# Patient Record
Sex: Female | Born: 1955 | Race: Black or African American | Hispanic: No | Marital: Single | Smoking: Never smoker
Health system: Southern US, Community
[De-identification: ages and names within clinical notes are randomized; demographics above are authoritative.]

## PROBLEM LIST (undated history)

## (undated) DIAGNOSIS — N200 Calculus of kidney: Secondary | ICD-10-CM

## (undated) DIAGNOSIS — E119 Type 2 diabetes mellitus without complications: Secondary | ICD-10-CM

## (undated) DIAGNOSIS — E785 Hyperlipidemia, unspecified: Secondary | ICD-10-CM

## (undated) DIAGNOSIS — K219 Gastro-esophageal reflux disease without esophagitis: Secondary | ICD-10-CM

## (undated) DIAGNOSIS — I10 Essential (primary) hypertension: Secondary | ICD-10-CM

## (undated) DIAGNOSIS — I2510 Atherosclerotic heart disease of native coronary artery without angina pectoris: Secondary | ICD-10-CM

## (undated) DIAGNOSIS — M1990 Unspecified osteoarthritis, unspecified site: Secondary | ICD-10-CM

## (undated) DIAGNOSIS — E669 Obesity, unspecified: Secondary | ICD-10-CM

## (undated) HISTORY — PX: DILATION AND CURETTAGE OF UTERUS: SHX78

---

## 2003-11-11 ENCOUNTER — Ambulatory Visit (HOSPITAL_COMMUNITY): Admission: RE | Admit: 2003-11-11 | Discharge: 2003-11-11 | Payer: Self-pay | Admitting: Internal Medicine

## 2004-01-04 ENCOUNTER — Encounter: Admission: RE | Admit: 2004-01-04 | Discharge: 2004-01-04 | Payer: Self-pay | Admitting: Internal Medicine

## 2004-03-18 ENCOUNTER — Ambulatory Visit: Payer: Self-pay | Admitting: Internal Medicine

## 2005-05-02 ENCOUNTER — Inpatient Hospital Stay (HOSPITAL_COMMUNITY): Admission: EM | Admit: 2005-05-02 | Discharge: 2005-05-06 | Payer: Self-pay | Admitting: Emergency Medicine

## 2005-08-26 ENCOUNTER — Emergency Department (HOSPITAL_COMMUNITY): Admission: EM | Admit: 2005-08-26 | Discharge: 2005-08-26 | Payer: Self-pay | Admitting: Family Medicine

## 2010-06-24 ENCOUNTER — Emergency Department (HOSPITAL_COMMUNITY)
Admission: EM | Admit: 2010-06-24 | Discharge: 2010-06-24 | Disposition: A | Discharge status: Home / Self Care | Payer: Managed Care, Other (non HMO) | Attending: Emergency Medicine | Admitting: Emergency Medicine

## 2010-06-24 DIAGNOSIS — E1149 Type 2 diabetes mellitus with other diabetic neurological complication: Secondary | ICD-10-CM | POA: Insufficient documentation

## 2010-06-24 DIAGNOSIS — I10 Essential (primary) hypertension: Secondary | ICD-10-CM | POA: Insufficient documentation

## 2010-06-24 DIAGNOSIS — E1142 Type 2 diabetes mellitus with diabetic polyneuropathy: Secondary | ICD-10-CM | POA: Insufficient documentation

## 2010-06-25 ENCOUNTER — Ambulatory Visit (HOSPITAL_COMMUNITY)
Admission: AD | Admit: 2010-06-25 | Discharge: 2010-06-25 | Disposition: A | Discharge status: Home / Self Care | Payer: Managed Care, Other (non HMO) | Source: Ambulatory Visit | Attending: Emergency Medicine | Admitting: Emergency Medicine

## 2010-06-25 DIAGNOSIS — M79609 Pain in unspecified limb: Secondary | ICD-10-CM

## 2011-07-19 ENCOUNTER — Encounter (HOSPITAL_COMMUNITY): Payer: Self-pay | Admitting: *Deleted

## 2011-07-19 ENCOUNTER — Emergency Department (HOSPITAL_COMMUNITY): Payer: 59

## 2011-07-19 ENCOUNTER — Emergency Department (HOSPITAL_COMMUNITY)
Admission: EM | Admit: 2011-07-19 | Discharge: 2011-07-20 | Disposition: A | Discharge status: Home / Self Care | Payer: 59 | Attending: Emergency Medicine | Admitting: Emergency Medicine

## 2011-07-19 DIAGNOSIS — R5381 Other malaise: Secondary | ICD-10-CM | POA: Insufficient documentation

## 2011-07-19 DIAGNOSIS — I10 Essential (primary) hypertension: Secondary | ICD-10-CM | POA: Insufficient documentation

## 2011-07-19 DIAGNOSIS — E119 Type 2 diabetes mellitus without complications: Secondary | ICD-10-CM | POA: Insufficient documentation

## 2011-07-19 DIAGNOSIS — Z79899 Other long term (current) drug therapy: Secondary | ICD-10-CM | POA: Insufficient documentation

## 2011-07-19 DIAGNOSIS — R350 Frequency of micturition: Secondary | ICD-10-CM | POA: Insufficient documentation

## 2011-07-19 DIAGNOSIS — R0602 Shortness of breath: Secondary | ICD-10-CM | POA: Insufficient documentation

## 2011-07-19 DIAGNOSIS — R739 Hyperglycemia, unspecified: Secondary | ICD-10-CM

## 2011-07-19 HISTORY — DX: Essential (primary) hypertension: I10

## 2011-07-19 LAB — BASIC METABOLIC PANEL
CO2: 24 mEq/L (ref 19–32)
Calcium: 10.3 mg/dL (ref 8.4–10.5)
Creatinine, Ser: 0.46 mg/dL — ABNORMAL LOW (ref 0.50–1.10)
GFR calc non Af Amer: >90 mL/min (ref >90)
Glucose, Bld: 410 mg/dL — ABNORMAL HIGH (ref 70–99)
Sodium: 135 mEq/L (ref 135–145)

## 2011-07-19 LAB — GLUCOSE, CAPILLARY
Glucose-Capillary: 425 mg/dL — ABNORMAL HIGH (ref 70–99)
Glucose-Capillary: 324 mg/dL — ABNORMAL HIGH (ref 70–99)

## 2011-07-19 LAB — URINALYSIS, ROUTINE W REFLEX MICROSCOPIC
Glucose, UA: >1000 mg/dL — AB
Hgb urine dipstick: NEGATIVE
Ketones, ur: NEGATIVE mg/dL
Protein, ur: NEGATIVE mg/dL
Urobilinogen, UA: 0.2 mg/dL (ref 0.0–1.0)

## 2011-07-19 LAB — CBC
HCT: 39.2 % (ref 36.0–46.0)
MCH: 27.9 pg (ref 26.0–34.0)
MCHC: 33.4 g/dL (ref 30.0–36.0)
MCV: 83.6 fL (ref 78.0–100.0)
Platelets: 358 10*3/uL (ref 150–400)
RDW: 14.4 % (ref 11.5–15.5)
WBC: 8.3 10*3/uL (ref 4.0–10.5)

## 2011-07-19 LAB — DIFFERENTIAL
Basophils Absolute: 0.1 10*3/uL (ref 0.0–0.1)
Eosinophils Absolute: 0.1 10*3/uL (ref 0.0–0.7)
Eosinophils Relative: 2 % (ref 0–5)
Lymphocytes Relative: 28 % (ref 12–46)
Monocytes Absolute: 0.8 10*3/uL (ref 0.1–1.0)

## 2011-07-19 MED ORDER — INSULIN ASPART 100 UNIT/ML ~~LOC~~ SOLN
8.0000 [IU] | Freq: Once | SUBCUTANEOUS | Status: AC
  Administered 2011-07-19: 8 [IU] via INTRAVENOUS

## 2011-07-19 MED ORDER — INSULIN ASPART 100 UNIT/ML ~~LOC~~ SOLN
SUBCUTANEOUS | Status: AC
  Administered 2011-07-19: 8 [IU] via INTRAVENOUS
  Filled 2011-07-19: qty 1

## 2011-07-19 MED ORDER — INSULIN REGULAR HUMAN 100 UNIT/ML IJ SOLN: 8.0000 [IU] | Freq: Once | INTRAMUSCULAR | Status: DC

## 2011-07-19 MED ORDER — INSULIN ASPART 100 UNIT/ML ~~LOC~~ SOLN
8.0000 [IU] | Freq: Once | SUBCUTANEOUS | Status: AC
  Administered 2011-07-19: 8 [IU] via INTRAVENOUS
  Filled 2011-07-19: qty 1

## 2011-07-19 MED ORDER — METFORMIN HCL 1000 MG PO TABS: 1000.0000 mg | ORAL_TABLET | Freq: Two times a day (BID) | ORAL | Status: DC

## 2011-07-19 MED ORDER — SODIUM CHLORIDE 0.9 % IV BOLUS (SEPSIS)
1000.0000 mL | Freq: Once | INTRAVENOUS | Status: AC
  Administered 2011-07-19: 1000 mL via INTRAVENOUS

## 2011-07-19 MED ORDER — GLYBURIDE 5 MG PO TABS: 5.0000 mg | ORAL_TABLET | Freq: Every day | ORAL | Status: DC

## 2011-07-19 NOTE — Discharge Instructions (Signed)
Hyperglycemia  Hyperglycemia occurs when the glucose (sugar) in your blood is too high. Hyperglycemia can happen for many reasons, but it most often happens to people who do not know they have diabetes or are not managing their diabetes properly.   CAUSES   Whether you have diabetes or not, there are other causes of hyperglycemia. Hyperglycemia can occur when you have diabetes, but it can also occur in other situations that you might not be as aware of, such as:  Diabetes  · If you have diabetes and are having problems controlling your blood glucose, hyperglycemia could occur because of some of the following reasons:  · Not following your meal plan.  · Not taking your diabetes medications or not taking it properly.  · Exercising less or doing less activity than you normally do.  · Being sick.  Pre-diabetes  · This cannot be ignored. Before people develop Type 2 diabetes, they almost always have "pre-diabetes." This is when your blood glucose levels are higher than normal, but not yet high enough to be diagnosed as diabetes. Research has shown that some long-term damage to the body, especially the heart and circulatory system, may already be occurring during pre-diabetes. If you take action to manage your blood glucose when you have pre-diabetes, you may delay or prevent Type 2 diabetes from developing.  Stress  · If you have diabetes, you may be "diet" controlled or on oral medications or insulin to control your diabetes. However, you may find that your blood glucose is higher than usual in the hospital whether you have diabetes or not. This is often referred to as "stress hyperglycemia." Stress can elevate your blood glucose. This happens because of hormones put out by the body during times of stress. If stress has been the cause of your high blood glucose, it can be followed regularly by your caregiver. That way he/she can make sure your hyperglycemia does not continue to get worse or progress to  diabetes.  Steroids  · Steroids are medications that act on the infection fighting system (immune system) to block inflammation or infection. One side effect can be a rise in blood glucose. Most people can produce enough extra insulin to allow for this rise, but for those who cannot, steroids make blood glucose levels go even higher. It is not unusual for steroid treatments to "uncover" diabetes that is developing. It is not always possible to determine if the hyperglycemia will go away after the steroids are stopped. A special blood test called an A1c is sometimes done to determine if your blood glucose was elevated before the steroids were started.  SYMPTOMS  · Thirsty.  · Frequent urination.  · Dry mouth.  · Blurred vision.  · Tired or fatigue.  · Weakness.  · Sleepy.  · Tingling in feet or leg.  DIAGNOSIS   Diagnosis is made by monitoring blood glucose in one or all of the following ways:  · A1c test. This is a chemical found in your blood.  · Fingerstick blood glucose monitoring.  · Laboratory results.  TREATMENT   First, knowing the cause of the hyperglycemia is important before the hyperglycemia can be treated. Treatment may include, but is not be limited to:  · Education.  · Change or adjustment in medications.  · Change or adjustment in meal plan.  · Treatment for an illness, infection, etc.  · More frequent blood glucose monitoring.  · Change in exercise plan.  · Decreasing or stopping steroids.  ·   Lifestyle changes.  HOME CARE INSTRUCTIONS   · Test your blood glucose as directed.  · Exercise regularly. Your caregiver will give you instructions about exercise. Pre-diabetes or diabetes which comes on with stress is helped by exercising.  · Eat wholesome, balanced meals. Eat often and at regular, fixed times. Your caregiver or nutritionist will give you a meal plan to guide your sugar intake.  · Being at an ideal weight is important. If needed, losing as little as 10 to 15 pounds may help improve blood  glucose levels.  SEEK MEDICAL CARE IF:   · You have questions about medicine, activity, or diet.  · You continue to have symptoms (problems such as increased thirst, urination, or weight gain).  SEEK IMMEDIATE MEDICAL CARE IF:   · You are vomiting or have diarrhea.  · Your breath smells fruity.  · You are breathing faster or slower.  · You are very sleepy or incoherent.  · You have numbness, tingling, or pain in your feet or hands.  · You have chest pain.  · Your symptoms get worse even though you have been following your caregiver's orders.  · If you have any other questions or concerns.  Document Released: 10/18/2000 Document Revised: 04/13/2011 Document Reviewed: 12/14/2008  [employer]® Patient Information ©2012 [employer].

## 2011-07-19 NOTE — ED Notes (Signed)
Went to room to ambulate with patient to bathroom to get urine; RN at bedside. Advised would come back when she was done.

## 2011-07-19 NOTE — ED Notes (Signed)
Pt in c/o hyperglycemia, states she has been running in the 400s recently, unable to get into [place] for medication change, currently taking metformin

## 2011-07-19 NOTE — ED Provider Notes (Signed)
History     CSN: [id number]  Arrival date & time 07/19/11  1656   First MD Initiated Contact with Patient 07/19/11 2048      Chief Complaint  Patient presents with  . Hyperglycemia    (Consider location/radiation/quality/duration/timing/severity/associated sxs/prior treatment) The history is provided by the patient.   patient has a history of diabetes. He states his sugars run high for the last several days to a week or 2. She states she's been running in the 400s. She states she's been urinating often. She states she was previously on Actos, but then switched over to metformin, and it does not work for her. No fevers. She states she feels generally weak. No chest pain. No abdominal pain. No cough. She states that the [place] will not pay for the Actos.  Past Medical History  Diagnosis Date  . Hypertension   . Diabetes mellitus     History reviewed. No pertinent past surgical history.  History reviewed. No pertinent family history.  History  Substance Use Topics  . Smoking status: Not on file  . Smokeless tobacco: Not on file  . Alcohol Use:     OB History    Grav Para Term Preterm Abortions TAB SAB Ect Mult Living                  Review of Systems  Constitutional: Positive for fatigue. Negative for activity change and appetite change.  HENT: Negative for neck stiffness.   Eyes: Negative for pain.  Respiratory: Negative for chest tightness and shortness of breath.   Cardiovascular: Negative for chest pain and leg swelling.  Gastrointestinal: Negative for nausea, vomiting, abdominal pain and diarrhea.  Genitourinary: Positive for frequency. Negative for flank pain.  Musculoskeletal: Negative for back pain.  Skin: Negative for rash.  Neurological: Negative for weakness, numbness and headaches.  Psychiatric/Behavioral: Negative for behavioral problems.    Allergies  Review of patient's allergies indicates no known allergies.  Home Medications   Current Outpatient  Rx  Name Route Sig Dispense Refill  . AMLODIPINE BESYLATE 10 MG PO TABS Oral Take 10 mg by mouth daily.    [name] METOPROLOL TARTRATE 50 MG PO TABS Oral Take 50 mg by mouth 2 (two) times daily.    [name] VITAMIN D (ERGOCALCIFEROL) 50000 UNITS PO CAPS Oral Take 50,000 Units by mouth every 7 (seven) days. Every sunday    . GLYBURIDE 5 MG PO TABS Oral Take 1 tablet (5 mg total) by mouth daily with breakfast. 30 tablet 0  . METFORMIN HCL 1000 MG PO TABS Oral Take 1 tablet (1,000 mg total) by mouth 2 (two) times daily. 60 tablet 0    BP 163/81  Pulse 98  Temp(Src) 97.9 F (36.6 C) (Oral)  Resp 18  SpO2 98%  Physical Exam  Nursing note and vitals reviewed. Constitutional: She is oriented to person, place, and time. She appears well-developed and well-nourished.  HENT:  Head: Normocephalic and atraumatic.  Eyes: EOM are normal. Pupils are equal, round, and reactive to light.  Neck: Normal range of motion. Neck supple.  Cardiovascular: Normal rate, regular rhythm and normal heart sounds.   No murmur heard. Pulmonary/Chest: Effort normal and breath sounds normal. No respiratory distress. She has no wheezes. She has no rales.  Abdominal: Soft. Bowel sounds are normal. She exhibits no distension. There is no tenderness. There is no rebound and no guarding.  Musculoskeletal: Normal range of motion.  Neurological: She is alert and oriented to person, place, and  time. No cranial nerve deficit.  Skin: Skin is warm and dry.  Psychiatric: She has a normal mood and affect. Her speech is normal.    ED Course  Procedures (including critical care time)  Labs Reviewed  GLUCOSE, CAPILLARY - Abnormal; Notable for the following:    Glucose-Capillary 425 (*)    All other components within normal limits  BASIC METABOLIC PANEL - Abnormal; Notable for the following:    Glucose, Bld 410 (*)    Creatinine, Ser 0.46 (*)    All other components within normal limits  URINALYSIS, ROUTINE W REFLEX MICROSCOPIC -  Abnormal; Notable for the following:    Specific Gravity, Urine 1.031 (*)    Glucose, UA >1000 (*)    All other components within normal limits  GLUCOSE, CAPILLARY - Abnormal; Notable for the following:    Glucose-Capillary 399 (*)    All other components within normal limits  GLUCOSE, CAPILLARY - Abnormal; Notable for the following:    Glucose-Capillary 324 (*)    All other components within normal limits  CBC  DIFFERENTIAL  URINE MICROSCOPIC-ADD ON   Dg Chest 2 View  07/19/2011  *RADIOLOGY REPORT*  Clinical Data: Shortness of breath and hyperglycemia  CHEST - 2 VIEW  Comparison: Chest radiograph 05/02/2005 and chest CT 12/02 10/2004.  Findings: The heart, mediastinal, and hilar contours are within normal limits.  The pulmonary vascularity is normal.  The trachea is midline.  The lungs are normally expanded and clear.  There is no pleural effusion or pneumothorax.  Visualized bony thorax and upper abdomen are unremarkable.  IMPRESSION: No acute cardiopulmonary disease.  Original Report Authenticated By: Britta Mccreedy, M.D.     1. Hyperglycemia       MDM  Patient is hyperglycemic. She is not in DKA. It is likely due to her change in medications. Her sugars been decreased with IV insulin and fluids. Her medicine regimen has been changed and she'll followup as needed      Juliet Rude. Rubin Payor, MD 07/20/11 0002

## 2011-07-20 LAB — GLUCOSE, CAPILLARY: Glucose-Capillary: 265 mg/dL — ABNORMAL HIGH (ref 70–99)

## 2011-07-20 NOTE — ED Notes (Signed)
Pt sts she has been feeling tired, weak and her CBG has been running high for 3 weeks. Patient sts that she has been on different medications the past few weeks, including  abx and steroids to treat an infection. Patient mentation well LOC x 4.

## 2011-07-20 NOTE — ED Notes (Signed)
Patient given discharge instructions, information, prescriptions, and diet order. Patient states that they adequately understand discharge information given and to return to ED if symptoms return or worsen.    Pt informed to follow up with her primary care giver about medications.

## 2012-12-17 ENCOUNTER — Other Ambulatory Visit: Payer: Self-pay | Admitting: Family Medicine

## 2012-12-17 DIAGNOSIS — R05 Cough: Secondary | ICD-10-CM

## 2012-12-18 ENCOUNTER — Ambulatory Visit
Admission: RE | Admit: 2012-12-18 | Discharge: 2012-12-18 | Disposition: A | Discharge status: Home / Self Care | Payer: 59 | Source: Ambulatory Visit | Attending: Family Medicine | Admitting: Family Medicine

## 2012-12-18 DIAGNOSIS — R05 Cough: Secondary | ICD-10-CM

## 2012-12-19 ENCOUNTER — Encounter: Payer: Self-pay | Admitting: Internal Medicine

## 2012-12-19 ENCOUNTER — Ambulatory Visit (INDEPENDENT_AMBULATORY_CARE_PROVIDER_SITE_OTHER): Payer: 59 | Admitting: Internal Medicine

## 2012-12-19 VITALS — BP 158/94 | HR 80 | Temp 98.2°F | Ht 66.5 in | Wt 238.0 lb

## 2012-12-19 DIAGNOSIS — E049 Nontoxic goiter, unspecified: Secondary | ICD-10-CM

## 2012-12-19 DIAGNOSIS — R05 Cough: Secondary | ICD-10-CM

## 2012-12-19 DIAGNOSIS — E010 Iodine-deficiency related diffuse (endemic) goiter: Secondary | ICD-10-CM

## 2012-12-19 MED ORDER — PREDNISONE (PAK) 10 MG PO TABS: ORAL_TABLET | ORAL | Status: DC

## 2012-12-19 MED ORDER — TRAMADOL HCL 50 MG PO TABS: ORAL_TABLET | ORAL | Status: DC

## 2012-12-19 MED ORDER — PANTOPRAZOLE SODIUM 40 MG PO TBEC: 40.0000 mg | DELAYED_RELEASE_TABLET | Freq: Every day | ORAL | Status: DC

## 2012-12-19 MED ORDER — FAMOTIDINE 20 MG PO TABS: ORAL_TABLET | ORAL | Status: DC

## 2012-12-19 NOTE — Patient Instructions (Addendum)
The key to effective treatment for your cough is eliminating the non-stop cycle of cough you're stuck in long enough to let your airway heal completely and then see if there is anything still making you cough once you stop the cough suppression, but this should take no more than 5 days to figure out  First take delsym two tsp every 12 hours and supplement if needed with  tramadol 50 mg up to 2 every 4 hours to suppress the urge to cough at all or even clear your throat. Swallowing water or using ice chips/non mint and menthol containing candies (such as lifesavers or sugarless jolly ranchers) are also effective.  You should rest your voice and avoid activities that you know make you cough.  Once you have eliminated the cough for 3 straight days try reducing the tramadol first,  then the delsym as tolerated.    Pantoprazole (protonix) 40 mg   Take 30-60 min before first meal of the day and Pepcid 20 mg one bedtime and chlortrimeton 4 mg at bedtime until no cough at all x 2 weeks off all cough meds  GERD (REFLUX)  is an extremely common cause of respiratory symptoms, many times with no significant heartburn at all.    It can be treated with medication, but also with lifestyle changes including avoidance of late meals, excessive alcohol, smoking cessation, and avoid fatty foods, chocolate, peppermint, colas, red wine, and acidic juices such as orange juice.  NO MINT OR MENTHOL PRODUCTS SO NO COUGH DROPS  USE SUGARLESS CANDY INSTEAD (jolley ranchers or Stover's)  NO OIL BASED VITAMINS - use powdered substitutes.    Prednisone 10 mg take  4 each am x 2 days,   2 each am x 2 days,  1 each am x 2 days and stop  If not better in 2 weeks will need follow up here.  You may have a goiter but I will defer follow up to Dr Loleta Chance as this is not at all likely to be causing you to cough

## 2012-12-19 NOTE — Progress Notes (Signed)
  Subjective:    Patient ID: Danielle Fisher, female    DOB: 04-17-1956 MRN: [id number]  HPI  26 yobf never smoker with no respiratory problems until developed cough around 2008 while living in [place] admitted for refractory cough ? Dx by specialist rx with Archie Balboa ranchers no inhlalers and comes and goes since then s pattern then developed again around late April 2014 and has become refractory to steroids/ zpak / advair/ jolley ranchers so Dr Loleta Chance referred her 12/19/2012 to pulmonary clinic.   12/19/2012 1st pulmonary / [place] eval/ Danielle Fisher cc daily generally better at night but only p takes cough suppression productive of min clear mucus, made worse by laughing,  Typically notes at onset with sore throat then dysphagia,exac same pattern this year around April.  Only sob with cough  No obvious daytime variabilty or assoc c r cp or chest tightness, subjective wheeze overt sinus or hb symptoms. No unusual exp hx or h/o childhood pna/ asthma or knowledge of premature birth.    Also denies any obvious fluctuation of symptoms with weather or environmental changes or other aggravating or alleviating factors except as outlined above   Review of Systems  Constitutional: Negative for fever, chills and unexpected weight change.  HENT: Positive for ear pain, sore throat, rhinorrhea, trouble swallowing and postnasal drip. Negative for nosebleeds, congestion, sneezing, dental problem, voice change and sinus pressure.   Eyes: Negative for visual disturbance.  Respiratory: Positive for cough and shortness of breath. Negative for choking.   Cardiovascular: Positive for leg swelling. Negative for chest pain.  Gastrointestinal: Negative for vomiting, abdominal pain and diarrhea.  Genitourinary: Negative for difficulty urinating.  Musculoskeletal: Negative for arthralgias.  Skin: Negative for rash.  Neurological: Positive for headaches. Negative for tremors and syncope.  Hematological: Does not bruise/bleed  easily.       Objective:   Physical Exam   Hoarse bf with harsh barking quality cough and pseudowheeze  Wt Readings from Last 3 Encounters:  12/19/12 238 lb (107.956 kg)     HEENT: nl dentition, turbinates, and orophanx. Nl external ear canals without cough reflex   NECK :  without JVD/Nodes/TM/ nl carotid upstrokes bilaterally   LUNGS: no acc muscle use, clear to A and P bilaterally without cough on insp or exp maneuvers   CV:  RRR  no s3 or murmur or increase in P2, no edema   ABD:  soft and nontender with nl excursion in the supine position. No bruits or organomegaly, bowel sounds nl  MS:  warm without deformities, calf tenderness, cyanosis or clubbing  SKIN: warm and dry without lesions    NEURO:  alert, approp, no deficits    12/17/12 CT chest   1. No lung parenchymal abnormality is noted. No effusion.  2. No mediastinal or hilar adenopathy.  3. Somewhat prominent thyroid possibly indicating thyroid goiter.  Consider ultrasound of the thyroid to assess further          Assessment & Plan:

## 2012-12-21 DIAGNOSIS — E010 Iodine-deficiency related diffuse (endemic) goiter: Secondary | ICD-10-CM | POA: Insufficient documentation

## 2012-12-21 HISTORY — DX: Iodine-deficiency related diffuse (endemic) goiter: E01.0

## 2012-12-21 NOTE — Assessment & Plan Note (Signed)
The most common causes of chronic cough in immunocompetent adults include the following: upper airway cough syndrome (UACS), previously referred to as postnasal drip syndrome (PNDS), which is caused by variety of rhinosinus conditions; (2) asthma; (3) GERD; (4) chronic bronchitis from cigarette smoking or other inhaled environmental irritants; (5) nonasthmatic eosinophilic bronchitis; and (6) bronchiectasis.   These conditions, singly or in combination, have accounted for up to 94% of the causes of chronic cough in prospective studies.   Other conditions have constituted no >6% of the causes in prospective studies These have included bronchogenic carcinoma, chronic interstitial pneumonia, sarcoidosis, left ventricular failure, ACEI-induced cough, and aspiration from a condition associated with pharyngeal dysfunction.    Chronic cough is often simultaneously caused by more than one condition. A single cause has been found from 38 to 82% of the time, multiple causes from 18 to 62%. Multiply caused cough has been the result of three diseases up to 42% of the time.       This is almost certainly  Classic Upper airway cough syndrome, so named because it's frequently impossible to sort out how much is  CR/sinusitis with freq throat clearing (which can be related to primary GERD)   vs  causing  secondary (" extra esophageal")  GERD from wide swings in gastric pressure that occur with throat clearing, often  promoting self use of mint and menthol lozenges that reduce the lower esophageal sphincter tone and exacerbate the problem further in a cyclical fashion.   These are the same pts (now being labeled as having "irritable larynx syndrome" by some cough centers) who not infrequently have a history of having failed to tolerate ace inhibitors,  dry powder inhalers or biphosphonates or report having atypical reflux symptoms that don't respond to standard doses of PPI , and are easily confused as having aecopd or  asthma flares by even experienced allergists/ pulmonologists - one of whom apparently "just treated me with Randell Patient and it got better" so at least one specialist agrees with me.  rec max rx for gerd/ cyclical cough then regroup.

## 2012-12-21 NOTE — Assessment & Plan Note (Signed)
Will need f/u with u/s as per radiology's rec > defer w/u to Dr Loleta Chance

## 2012-12-31 ENCOUNTER — Telehealth: Payer: Self-pay | Admitting: Internal Medicine

## 2012-12-31 MED ORDER — ACETAMINOPHEN-CODEINE #3 300-30 MG PO TABS: 1.0000 | ORAL_TABLET | ORAL | Status: DC | PRN

## 2012-12-31 NOTE — Telephone Encounter (Signed)
Returning call.Danielle Fisher ° °

## 2012-12-31 NOTE — Telephone Encounter (Signed)
Spoke with the pt She was last seen for initial consult for cough on 12/19/12 She states still taking all meds as prescribed and her cough is no better at all, and is no worse She is still taking tramadol and delsym daily with only minimal relief I advised per AVS instructions, she will need to come in for ov She understands this, but is unable to take any time of of work until her planned appt here on 01/07/13 She is requesting that something be called in  Pt denied having any new respiratory co's  Please advise, thanks! No Known Allergies

## 2012-12-31 NOTE — Telephone Encounter (Signed)
Pt states she is taking the tramadol 2 every 4 hours on schedule and it is not helping. Rx sent. Carron Curie, CMA

## 2012-12-31 NOTE — Telephone Encounter (Signed)
LMTCB

## 2012-12-31 NOTE — Telephone Encounter (Signed)
Make sure she's taking the tramadol 50 up to 2 every 4 hours and if so and no relief then change to Tylenol #3 one every 4 prn #40

## 2013-01-07 ENCOUNTER — Ambulatory Visit: Payer: 59 | Admitting: Internal Medicine

## 2013-01-14 ENCOUNTER — Ambulatory Visit: Payer: 59 | Admitting: Internal Medicine

## 2013-03-15 ENCOUNTER — Emergency Department (HOSPITAL_COMMUNITY)
Admission: EM | Admit: 2013-03-15 | Discharge: 2013-03-15 | Disposition: A | Discharge status: Home / Self Care | Payer: 59 | Attending: Emergency Medicine | Admitting: Emergency Medicine

## 2013-03-15 ENCOUNTER — Encounter (HOSPITAL_COMMUNITY): Payer: Self-pay | Admitting: Emergency Medicine

## 2013-03-15 DIAGNOSIS — R071 Chest pain on breathing: Secondary | ICD-10-CM | POA: Insufficient documentation

## 2013-03-15 DIAGNOSIS — R609 Edema, unspecified: Secondary | ICD-10-CM | POA: Insufficient documentation

## 2013-03-15 DIAGNOSIS — R0789 Other chest pain: Secondary | ICD-10-CM

## 2013-03-15 DIAGNOSIS — Z79899 Other long term (current) drug therapy: Secondary | ICD-10-CM | POA: Insufficient documentation

## 2013-03-15 DIAGNOSIS — I10 Essential (primary) hypertension: Secondary | ICD-10-CM | POA: Insufficient documentation

## 2013-03-15 DIAGNOSIS — E119 Type 2 diabetes mellitus without complications: Secondary | ICD-10-CM | POA: Insufficient documentation

## 2013-03-15 LAB — BASIC METABOLIC PANEL
BUN: 14 mg/dL (ref 6–23)
Chloride: 100 mEq/L (ref 96–112)
Creatinine, Ser: 0.56 mg/dL (ref 0.50–1.10)
GFR calc Af Amer: >90 mL/min (ref >90)
Glucose, Bld: 339 mg/dL — ABNORMAL HIGH (ref 70–99)

## 2013-03-15 LAB — CBC
HCT: 38.2 % (ref 36.0–46.0)
Hemoglobin: 12.9 g/dL (ref 12.0–15.0)
MCV: 84.3 fL (ref 78.0–100.0)
RDW: 13.6 % (ref 11.5–15.5)
WBC: 10.2 10*3/uL (ref 4.0–10.5)

## 2013-03-15 MED ORDER — HYDROCODONE-ACETAMINOPHEN 5-325 MG PO TABS: 1.0000 | ORAL_TABLET | ORAL | Status: DC | PRN

## 2013-03-15 MED ORDER — OXYCODONE-ACETAMINOPHEN 5-325 MG PO TABS
1.0000 | ORAL_TABLET | Freq: Once | ORAL | Status: AC
  Administered 2013-03-15: 1 via ORAL
  Filled 2013-03-15: qty 1

## 2013-03-15 NOTE — ED Notes (Signed)
Received pt from home with c/o centered chest pain that radiates to back onset in the middle of the night. Pt tried pepcid last night without relief. Pt denies N/V, shortness of breath or diaphoresis. Pt given 4 nitro  and 6 mg of Morphine by EMS. Pain initially 9/10 now 1/10.

## 2013-03-15 NOTE — ED Notes (Signed)
MD aware of patient vital signs and pain level at discharge, states ok to discharge home, pt states she has not taken her daily BP medication and will take it when she gets home.

## 2013-03-15 NOTE — ED Provider Notes (Signed)
CSN: [id number]     Arrival date & time 03/15/13  0809 History   First MD Initiated Contact with Patient 03/15/13 0813     Chief Complaint  Patient presents with  . Chest Pain   (Consider location/radiation/quality/duration/timing/severity/associated sxs/prior Treatment) HPI Comments: Danielle Fisher is a 57 y.o. female who presents complaining of chest pain, for 9 or 10 hours. The chest pain is improving. She was evaluated by me at [phone]. At this time. The chest pain, was 1/10. Prior to that it had been as bad as 9/10. She was treated by EMS with aspirin, nitroglycerin, and morphine. She feels that these treatments have helped. She has had pain similar to this in the past. She does not have known cardiac disease. She has diabetes and hypertension, and is taking her medicine, as prescribed. She denies fever, chills, cough, or shortness of breath. There are no other known modifying factors.   Patient is a 57 y.o. female presenting with chest pain. The history is provided by the patient.  Chest Pain   Past Medical History  Diagnosis Date  . Hypertension   . Diabetes mellitus    History reviewed. No pertinent past surgical history. Family History  Problem Relation Age of Onset  . Heart disease Mother   . Breast cancer Sister    History  Substance Use Topics  . Smoking status: Never Smoker   . Smokeless tobacco: Never Used  . Alcohol Use: No   OB History   Grav Para Term Preterm Abortions TAB SAB Ect Mult Living                 Review of Systems  Cardiovascular: Positive for chest pain.  All other systems reviewed and are negative.    Allergies  Review of patient's allergies indicates no known allergies.  Home Medications   Current Outpatient Rx  Name  Route  Sig  Dispense  Refill  . amLODipine (NORVASC) 10 MG tablet   Oral   Take 10 mg by mouth daily.         . famotidine (PEPCID) 20 MG tablet   Oral   Take 20 mg by mouth every 6 (six) hours as needed for  heartburn or indigestion.         [name] glyBURIDE (DIABETA) 5 MG tablet   Oral   Take 20 mg by mouth 2 (two) times daily with a meal.          . metFORMIN (GLUCOPHAGE) 500 MG tablet   Oral   Take 1,000 mg by mouth 2 (two) times daily with a meal.          . metoprolol (LOPRESSOR) 50 MG tablet   Oral   Take 50 mg by mouth 2 (two) times daily.         . rosuvastatin (CRESTOR) 5 MG tablet   Oral   Take 5 mg by mouth daily.         [name] EXPIRED: glyBURIDE (DIABETA) 5 MG tablet   Oral   Take 1 tablet (5 mg total) by mouth daily with breakfast.   30 tablet   0   . HYDROcodone-acetaminophen (NORCO) 5-325 MG per tablet   Oral   Take 1 tablet by mouth every 4 (four) hours as needed.   20 tablet   0   . EXPIRED: metFORMIN (GLUCOPHAGE) 1000 MG tablet   Oral   Take 1 tablet (1,000 mg total) by mouth 2 (two) times daily.   60 tablet  0    BP 184/97  Pulse 101  Temp(Src) 98.1 F (36.7 C) (Oral)  Resp 18  Ht 5\' 6"  (1.676 m)  Wt 220 lb (99.791 kg)  BMI 35.53 kg/m2  SpO2 94% Physical Exam  Nursing note and vitals reviewed. Constitutional: She is oriented to person, place, and time. She appears well-developed and well-nourished.  HENT:  Head: Normocephalic and atraumatic.  Eyes: Conjunctivae and EOM are normal. Pupils are equal, round, and reactive to light.  Neck: Normal range of motion and phonation normal. Neck supple.  Cardiovascular: Normal rate, regular rhythm and intact distal pulses.   Pulmonary/Chest: Effort normal and breath sounds normal. She exhibits no tenderness (moderate bilateral anterior chest wall tenderness. She states that palpation reproduces her chest pain, that she has been having.).  Abdominal: Soft. She exhibits no distension. There is no tenderness. There is no guarding.  Musculoskeletal: Normal range of motion. She exhibits edema (Mild bilateral). She exhibits no tenderness.  Neurological: She is alert and oriented to person, place, and time. She  exhibits normal muscle tone.  Skin: Skin is warm and dry.  Psychiatric: She has a normal mood and affect. Her behavior is normal. Judgment and thought content normal.    ED Course  Procedures (including critical care time)  Medications  oxyCODONE-acetaminophen (PERCOCET/ROXICET) 5-325 MG per tablet 1 tablet (1 tablet Oral Given 03/15/13 0949)    Patient Vitals for the past 24 hrs:  BP Temp Temp src Pulse Resp SpO2 Height Weight  03/15/13 1300 184/97 mmHg - - 101 18 94 % - -  03/15/13 1236 - - - - 24 - - -  03/15/13 1200 180/94 mmHg - - 98 - 95 % - -  03/15/13 1130 165/71 mmHg - - 84 22 99 % - -  03/15/13 1030 176/85 mmHg - - 81 21 98 % - -  03/15/13 0941 164/79 mmHg - - 98 22 98 % - -  03/15/13 0820 147/70 mmHg 98.1 F (36.7 C) Oral 90 18 96 % - -  03/15/13 0814 - - - - - - 5\' 6"  (1.676 m) 220 lb (99.791 kg)  03/15/13 0810 - - - - - 96 % - -     9:46 AM Reevaluation with update and discussion. After initial assessment and treatment, an updated evaluation reveals she states that now, her chest pain is 4/10. No additional complaints. Percocet ordered. [name] L   12:59 PM Reevaluation with update and discussion. After initial assessment and treatment, an updated evaluation reveals  she is comfortable, now, repeat vital signs are reassuring . [name] L     Labs Review Labs Reviewed  BASIC METABOLIC PANEL - Abnormal; Notable for the following:    Glucose, Bld 339 (*)    Calcium 10.6 (*)    All other components within normal limits  CBC  POCT I-STAT TROPONIN I   Imaging Review No results found.  EKG Interpretation     Ventricular Rate:  84 PR Interval:  203 QRS Duration: 100 QT Interval:  395 QTC Calculation: 467 R Axis:   23 Text Interpretation:  Sinus rhythm Borderline prolonged PR interval Abnormal R-wave progression, early transition Probable LVH with secondary repol abnrm ST elevation, consider inferior injury          No old EKG for  comparison. Initial ECG at 0818  Repeat EKG-    Date: 03/15/13, 1234  Rate: 97  Rhythm: normal sinus rhythm  QRS Axis: normal  PR and QT Intervals: PR prolonged  ST/T Wave abnormalities: nonspecific ST changes  PR and QRS Conduction Disutrbances:complete heart block, right bundle branch block and nonspecific PR prolongation  Narrative Interpretation:   Old EKG Reviewed: compared to earlier EKG, inferior injury is unlikely    MDM   1. Chest wall pain     Nonspecific chest pain, continuous for at least 9 hours with negative EP evaluation. Doubt ACS, PE, or pneumonia. Patient's pain improved with narcotics. Her pain has a component of chest wall involvement.. I believe she is stable for discharge and outpatient evaluation by her PCP put him consider further evaluation to include outpatient cardiac stress testing.    Nursing Notes Reviewed/ Care Coordinated, and agree without changes. Applicable Imaging Reviewed.  Interpretation of Laboratory Data incorporated into ED treatment    Plan: Home Medications- Norco; Home Treatments and Observation- heat to sore area; return here if the recommended treatment, does not improve the symptoms; Recommended follow up- PCP 4-5 days    Flint Melter, MD 03/15/13 1630

## 2013-07-24 ENCOUNTER — Ambulatory Visit (INDEPENDENT_AMBULATORY_CARE_PROVIDER_SITE_OTHER): Payer: 59 | Admitting: Emergency Medicine

## 2013-07-24 VITALS — BP 192/94 | HR 85 | Temp 97.9°F | Resp 20 | Ht 67.0 in | Wt 231.0 lb

## 2013-07-24 DIAGNOSIS — H6120 Impacted cerumen, unspecified ear: Secondary | ICD-10-CM

## 2013-07-24 NOTE — Progress Notes (Signed)
[facility] [address], [place] [address] [phone]  Date:  07/24/2013   Name:  Danielle RichmondKathleen Forlenza   DOB:  Mar 22, 1956   MRN:  [id number]  PCP:  Evlyn CourierHILL,GERALD K, MD    Chief Complaint: Cerumen Impaction   History of Present Illness:  Danielle RichmondKathleen Mancino is a 58 y.o. very pleasant female patient who presents with the following:  Says she has decreased hearing over the past month and thinks she needs her ears cleaned.  Never had previously.  No antecedent infection or injury.  Used home treatment with no improvement.  Denies pain in ears. No improvement with over the counter medications or other home remedies. Denies other complaint or health concern today.   Patient Active Problem List   Diagnosis Date Noted  . Thyromegaly 12/21/2012  . Cough 12/19/2012    Past Medical History  Diagnosis Date  . Hypertension   . Diabetes mellitus     History reviewed. No pertinent past surgical history.  History  Substance Use Topics  . Smoking status: Never Smoker   . Smokeless tobacco: Never Used  . Alcohol Use: No    Family History  Problem Relation Age of Onset  . Heart disease Mother   . Stroke Mother   . Breast cancer Sister   . Diabetes Sister   . Stroke Father   . Diabetes Father   . Diabetes Sister     No Known Allergies  Medication list has been reviewed and updated.  Current Outpatient Prescriptions on File Prior to Visit  Medication Sig Dispense Refill  . amLODipine (NORVASC) 10 MG tablet Take 10 mg by mouth daily.      . famotidine (PEPCID) 20 MG tablet Take 20 mg by mouth every 6 (six) hours as needed for heartburn or indigestion.      [name]. glyBURIDE (DIABETA) 5 MG tablet Take 20 mg by mouth 2 (two) times daily with a meal.       . metFORMIN (GLUCOPHAGE) 500 MG tablet Take 1,000 mg by mouth 2 (two) times daily with a meal.       . metoprolol (LOPRESSOR) 50 MG tablet Take 50 mg by mouth 2 (two) times daily.      . rosuvastatin (CRESTOR) 5  MG tablet Take 5 mg by mouth daily.       No current facility-administered medications on file prior to visit.    Review of Systems:  As per HPI, otherwise negative.    Physical Examination: Filed Vitals:   07/24/13 1719  BP: 192/94  Pulse: 85  Temp: 97.9 F (36.6 C)  Resp: 20   Filed Vitals:   07/24/13 1719  Height: 5\' 7"  (1.702 m)  Weight: 231 lb (104.781 kg)   Body mass index is 36.17 kg/(m^2). Ideal Body Weight: Weight in (lb) to have BMI = 25: 159.3   GEN: WDWN, NAD, Non-toxic, Alert & Oriented x 3 HEENT: Atraumatic, Normocephalic.  Ears and Nose: No external deformity.  Bilateral cerumen impaction EXTR: No clubbing/cyanosis/edema NEURO: Normal gait.  PSYCH: Normally interactive. Conversant. Not depressed or anxious appearing.  Calm demeanor.    Assessment and Plan: Sudden hearing loss Cerumen impaction  Signed,  Phillips OdorJeffery Pascuala Klutts, MD

## 2015-04-20 ENCOUNTER — Emergency Department (HOSPITAL_COMMUNITY): Payer: 59

## 2015-04-20 ENCOUNTER — Encounter (HOSPITAL_COMMUNITY): Payer: Self-pay | Admitting: Emergency Medicine

## 2015-04-20 ENCOUNTER — Inpatient Hospital Stay (HOSPITAL_COMMUNITY)
Admission: EM | Admit: 2015-04-20 | Discharge: 2015-04-22 | DRG: 247 | Disposition: A | Discharge status: Home / Self Care | Payer: 59 | Attending: Cardiovascular Disease | Admitting: Cardiovascular Disease

## 2015-04-20 DIAGNOSIS — Z955 Presence of coronary angioplasty implant and graft: Secondary | ICD-10-CM

## 2015-04-20 DIAGNOSIS — I2510 Atherosclerotic heart disease of native coronary artery without angina pectoris: Secondary | ICD-10-CM

## 2015-04-20 DIAGNOSIS — E119 Type 2 diabetes mellitus without complications: Secondary | ICD-10-CM

## 2015-04-20 DIAGNOSIS — E785 Hyperlipidemia, unspecified: Secondary | ICD-10-CM | POA: Diagnosis not present

## 2015-04-20 DIAGNOSIS — I214 Non-ST elevation (NSTEMI) myocardial infarction: Principal | ICD-10-CM

## 2015-04-20 DIAGNOSIS — I10 Essential (primary) hypertension: Secondary | ICD-10-CM | POA: Diagnosis not present

## 2015-04-20 DIAGNOSIS — Z7982 Long term (current) use of aspirin: Secondary | ICD-10-CM

## 2015-04-20 DIAGNOSIS — Z79899 Other long term (current) drug therapy: Secondary | ICD-10-CM

## 2015-04-20 DIAGNOSIS — E1165 Type 2 diabetes mellitus with hyperglycemia: Secondary | ICD-10-CM | POA: Diagnosis present

## 2015-04-20 DIAGNOSIS — Z9861 Coronary angioplasty status: Secondary | ICD-10-CM

## 2015-04-20 DIAGNOSIS — R079 Chest pain, unspecified: Secondary | ICD-10-CM | POA: Diagnosis not present

## 2015-04-20 DIAGNOSIS — E669 Obesity, unspecified: Secondary | ICD-10-CM

## 2015-04-20 DIAGNOSIS — Z6835 Body mass index (BMI) 35.0-35.9, adult: Secondary | ICD-10-CM

## 2015-04-20 DIAGNOSIS — Z794 Long term (current) use of insulin: Secondary | ICD-10-CM

## 2015-04-20 HISTORY — DX: Gastro-esophageal reflux disease without esophagitis: K21.9

## 2015-04-20 HISTORY — DX: Unspecified osteoarthritis, unspecified site: M19.90

## 2015-04-20 HISTORY — DX: Type 2 diabetes mellitus without complications: E11.9

## 2015-04-20 HISTORY — DX: Calculus of kidney: N20.0

## 2015-04-20 HISTORY — DX: Atherosclerotic heart disease of native coronary artery without angina pectoris: I25.10

## 2015-04-20 HISTORY — DX: Hyperlipidemia, unspecified: E78.5

## 2015-04-20 HISTORY — DX: Obesity, unspecified: E66.9

## 2015-04-20 LAB — BASIC METABOLIC PANEL
Anion gap: 9 (ref 5–15)
BUN: 15 mg/dL (ref 6–20)
CALCIUM: 10.3 mg/dL (ref 8.9–10.3)
CO2: 22 mmol/L (ref 22–32)
CREATININE: 0.86 mg/dL (ref 0.44–1.00)
Chloride: 106 mmol/L (ref 101–111)
Glucose, Bld: 220 mg/dL — ABNORMAL HIGH (ref 65–99)
Potassium: 4.5 mmol/L (ref 3.5–5.1)
SODIUM: 137 mmol/L (ref 135–145)

## 2015-04-20 LAB — GLUCOSE, CAPILLARY
GLUCOSE-CAPILLARY: 87 mg/dL (ref 65–99)
Glucose-Capillary: 136 mg/dL — ABNORMAL HIGH (ref 65–99)

## 2015-04-20 LAB — CBC
HCT: 38.4 % (ref 36.0–46.0)
HCT: 40.3 % (ref 36.0–46.0)
HEMOGLOBIN: 13.1 g/dL (ref 12.0–15.0)
Hemoglobin: 12.4 g/dL (ref 12.0–15.0)
MCH: 27 pg (ref 26.0–34.0)
MCH: 27 pg (ref 26.0–34.0)
MCHC: 32.3 g/dL (ref 30.0–36.0)
MCHC: 32.5 g/dL (ref 30.0–36.0)
MCV: 83.7 fL (ref 78.0–100.0)
MCV: 82.9 fL (ref 78.0–100.0)
PLATELETS: 318 10*3/uL (ref 150–400)
Platelets: 357 10*3/uL (ref 150–400)
RBC: 4.59 MIL/uL (ref 3.87–5.11)
RBC: 4.86 MIL/uL (ref 3.87–5.11)
RDW: 14.9 % (ref 11.5–15.5)
RDW: 15 % (ref 11.5–15.5)
WBC: 10 10*3/uL (ref 4.0–10.5)
WBC: 10.2 10*3/uL (ref 4.0–10.5)

## 2015-04-20 LAB — HEPATIC FUNCTION PANEL
ALT: 32 U/L (ref 14–54)
AST: 35 U/L (ref 15–41)
Albumin: 3.5 g/dL (ref 3.5–5.0)
Alkaline Phosphatase: 162 U/L — ABNORMAL HIGH (ref 38–126)
BILIRUBIN DIRECT: 0.2 mg/dL (ref 0.1–0.5)
BILIRUBIN INDIRECT: 0.8 mg/dL (ref 0.3–0.9)
Total Bilirubin: 1 mg/dL (ref 0.3–1.2)
Total Protein: 7.2 g/dL (ref 6.5–8.1)

## 2015-04-20 LAB — LIPASE, BLOOD: LIPASE: 31 U/L (ref 11–51)

## 2015-04-20 LAB — CREATININE, SERUM: CREATININE: 0.78 mg/dL (ref 0.44–1.00)

## 2015-04-20 LAB — I-STAT TROPONIN, ED: TROPONIN I, POC: 0.03 ng/mL (ref 0.00–0.08)

## 2015-04-20 LAB — TROPONIN I
Troponin I: 0.3 ng/mL — ABNORMAL HIGH (ref <0.031)
Troponin I: 0.45 ng/mL — ABNORMAL HIGH (ref <0.031)

## 2015-04-20 MED ORDER — ROSUVASTATIN CALCIUM 10 MG PO TABS
5.0000 mg | ORAL_TABLET | Freq: Every day | ORAL | Status: DC
  Administered 2015-04-21: 5 mg via ORAL
  Filled 2015-04-20: qty 1

## 2015-04-20 MED ORDER — HEPARIN SODIUM (PORCINE) 5000 UNIT/ML IJ SOLN
5000.0000 [IU] | Freq: Three times a day (TID) | INTRAMUSCULAR | Status: DC
  Administered 2015-04-20: 5000 [IU] via SUBCUTANEOUS
  Filled 2015-04-20: qty 1

## 2015-04-20 MED ORDER — ACETAMINOPHEN 325 MG PO TABS
650.0000 mg | ORAL_TABLET | Freq: Four times a day (QID) | ORAL | Status: DC | PRN
  Administered 2015-04-20 – 2015-04-21 (×2): 650 mg via ORAL
  Filled 2015-04-20 (×2): qty 2

## 2015-04-20 MED ORDER — REGADENOSON 0.4 MG/5ML IV SOLN
0.4000 mg | Freq: Once | INTRAVENOUS | Status: DC
  Filled 2015-04-20: qty 5

## 2015-04-20 MED ORDER — INSULIN ASPART 100 UNIT/ML ~~LOC~~ SOLN
0.0000 [IU] | Freq: Three times a day (TID) | SUBCUTANEOUS | Status: DC
  Administered 2015-04-22: 13:00:00 5 [IU] via SUBCUTANEOUS
  Administered 2015-04-22: 07:00:00 2 [IU] via SUBCUTANEOUS

## 2015-04-20 MED ORDER — HEPARIN (PORCINE) IN NACL 100-0.45 UNIT/ML-% IJ SOLN
1350.0000 [IU]/h | INTRAMUSCULAR | Status: DC
  Administered 2015-04-20: 1200 [IU]/h via INTRAVENOUS
  Filled 2015-04-20 (×2): qty 250

## 2015-04-20 MED ORDER — METOPROLOL TARTRATE 25 MG PO TABS
50.0000 mg | ORAL_TABLET | Freq: Two times a day (BID) | ORAL | Status: DC
  Administered 2015-04-20 – 2015-04-22 (×4): 50 mg via ORAL
  Filled 2015-04-20: qty 2
  Filled 2015-04-20 (×2): qty 1
  Filled 2015-04-20: qty 2

## 2015-04-20 MED ORDER — METFORMIN HCL 500 MG PO TABS
1000.0000 mg | ORAL_TABLET | Freq: Two times a day (BID) | ORAL | Status: DC
  Administered 2015-04-20 – 2015-04-21 (×2): 1000 mg via ORAL
  Filled 2015-04-20 (×2): qty 2

## 2015-04-20 MED ORDER — ASPIRIN EC 81 MG PO TBEC
81.0000 mg | DELAYED_RELEASE_TABLET | Freq: Every day | ORAL | Status: DC
  Administered 2015-04-21 – 2015-04-22 (×3): 81 mg via ORAL
  Filled 2015-04-20 (×3): qty 1

## 2015-04-20 MED ORDER — HEPARIN BOLUS VIA INFUSION
4000.0000 [IU] | Freq: Once | INTRAVENOUS | Status: AC
  Administered 2015-04-20: 4000 [IU] via INTRAVENOUS
  Filled 2015-04-20: qty 4000

## 2015-04-20 NOTE — Progress Notes (Addendum)
     RN paged to notify me that troponin is elevated to 0.3. I will start heparin gtt. Patient is currently chest pain free. Will have him seen early tomorrow in rounds as he may need LHC.  Cline CrockKathryn Charitie Hinote PA-C  [facility]

## 2015-04-20 NOTE — ED Notes (Signed)
Patient states she had a X-ray and advised she has an enlarged heart and need a Echo.

## 2015-04-20 NOTE — Progress Notes (Signed)
ANTICOAGULATION CONSULT NOTE - Initial Consult  Pharmacy Consult for Heparin Indication: chest pain/ACS  No Known Allergies  Patient Measurements: Height: 5\' 7"  (170.2 cm) Weight: 238 lb (107.956 kg) IBW/kg (Calculated) : 61.6 Heparin Dosing Weight: 86.3 kg  Vital Signs: Temp: 97.8 F (36.6 C) (12/13 1001) Temp Source: Oral (12/13 1001) BP: 145/88 mmHg (12/13 1600) Pulse Rate: 73 (12/13 1515)  Labs:  Recent Labs  04/20/15 1010 04/20/15 1635  HGB 12.4 13.1  HCT 38.4 40.3  PLT 318 357  CREATININE 0.86 0.78  TROPONINI  --  0.30*    Estimated Creatinine Clearance: 93.5 mL/min (by C-G formula based on Cr of 0.78).   Medical History: Past Medical History  Diagnosis Date  . Hypertension   . Kidney stones   . Hyperlipidemia   . Type II diabetes mellitus (HCC)   . GERD (gastroesophageal reflux disease)   . Headache     "monthly" (04/20/2015)  . Arthritis     "legs, arms" (04/20/2015)    Assessment: CP 59 y/o F presents with CP x 1 week. PMH DM poorly controlled, HTN, HLD.  She [facility] the [facility] [place]. She noted they did a CXR that was fairly normal, except for the appearance of an "enlarged heart". . Says here Hgb A1c 2 weeks ago was 10. Says she is compliant with her medication. No anticoagulation PTA. Baseline labs WNL. Start IV heparin for elevated enzymes.  Labs: Troponin 0.3, Scr 0.86, Hgb 13.1. Plts 357  Goal of Therapy:  Heparin level 0.3-0.7 units/ml Monitor platelets by anticoagulation protocol: Yes   Plan:  Heparin bolus 4000 units with infusion 1200 units/hr Check heparin level in 6-8 hrs. Daily heparin level and CBC   Trista Ciocca S. Merilynn Finlandobertson, PharmD, BCPS Clinical Staff Pharmacist Pager [phone]  Misty Stanleyobertson, Antionne Enrique Stillinger 04/20/2015,6:54 PM

## 2015-04-20 NOTE — ED Provider Notes (Signed)
CSN: [id number]     Arrival date & time 04/20/15  8295 History   First MD Initiated Contact with Patient 04/20/15 [phone]     Chief Complaint  Patient presents with  . Chest Pain     Patient is a 59 y.o. female presenting with chest pain. The history is provided by the patient. No language interpreter was used.  Chest Pain  Mercedees Convery is a 59 y.o. female who presents to the Emergency Department complaining of chest pain.  She reports one week of waxing and waning central chest pain.  She was seen at the [facility] for cough about a week ago and was started on a stomach medicine by the pulmonary doctor and had a chest xray performed that demonstrated cardiomegaly.  Her chest pain increased last night, temporarily improved and then returned on her way to work.  On the way to work it was severe and located in the central chest, upper back, and bilateral jaws.  She took  ASA and the pain has completely resolved.  She denies any associated SOB, diaphoresis, nausea, abdominal pain, lower extremity edema.  Pain does not change with activity, position, meals.  She has a hx/o DM, HTN, HPL, family hx/o CAD - father with MI in late 80s.    Past Medical History  Diagnosis Date  . Hypertension   . Diabetes mellitus    History reviewed. No pertinent past surgical history. Family History  Problem Relation Age of Onset  . Heart disease Mother   . Stroke Mother   . Breast cancer Sister   . Diabetes Sister   . Stroke Father   . Diabetes Father   . Diabetes Sister    Social History  Substance Use Topics  . Smoking status: Never Smoker   . Smokeless tobacco: Never Used  . Alcohol Use: No   OB History    No data available     Review of Systems  Cardiovascular: Positive for chest pain.  All other systems reviewed and are negative.     Allergies  Review of patient's allergies indicates no known allergies.  Home Medications   Prior to Admission medications   Medication Sig Start Date  End Date Taking? Authorizing Provider  amLODipine (NORVASC) 10 MG tablet Take 10 mg by mouth daily.    Historical Provider, MD  famotidine (PEPCID) 20 MG tablet Take 20 mg by mouth every 6 (six) hours as needed for heartburn or indigestion.    Historical Provider, MD  glyBURIDE (DIABETA) 5 MG tablet Take 20 mg by mouth 2 (two) times daily with a meal.     Historical Provider, MD  metFORMIN (GLUCOPHAGE) 500 MG tablet Take 1,000 mg by mouth 2 (two) times daily with a meal.     Historical Provider, MD  metoprolol (LOPRESSOR) 50 MG tablet Take 50 mg by mouth 2 (two) times daily.    Historical Provider, MD  rosuvastatin (CRESTOR) 5 MG tablet Take 5 mg by mouth daily.    Historical Provider, MD   BP 153/84 mmHg  Pulse 72  Temp(Src) 97.8 F (36.6 C) (Oral)  Resp 18  SpO2 98% Physical Exam  Constitutional: She is oriented to person, place, and time. She appears well-developed and well-nourished.  HENT:  Head: Normocephalic and atraumatic.  Cardiovascular: Normal rate and regular rhythm.   No murmur heard. Pulmonary/Chest: Effort normal and breath sounds normal. No respiratory distress. She exhibits no tenderness.  Abdominal: Soft. There is no rebound and no guarding.  Mild epigastric  tenderness  Musculoskeletal: She exhibits no tenderness.  1+ nonpitting edema in BLE  Neurological: She is alert and oriented to person, place, and time.  Skin: Skin is warm and dry.  Psychiatric: She has a normal mood and affect. Her behavior is normal.  Nursing note and vitals reviewed.   ED Course  Procedures (including critical care time) Labs Review Labs Reviewed  BASIC METABOLIC PANEL - Abnormal; Notable for the following:    Glucose, Bld 220 (*)    All other components within normal limits  HEPATIC FUNCTION PANEL - Abnormal; Notable for the following:    Alkaline Phosphatase 162 (*)    All other components within normal limits  TROPONIN I - Abnormal; Notable for the following:    Troponin I 0.30  (*)    All other components within normal limits  CBC  LIPASE, BLOOD  CBC  CREATININE, SERUM  GLUCOSE, CAPILLARY  TROPONIN I  TROPONIN I  BASIC METABOLIC PANEL  CBC  I-STAT TROPOININ, ED    Imaging Review Dg Chest 2 View  04/20/2015  CLINICAL DATA:  Intermittent chest pain for 2 weeks. EXAM: CHEST  2 VIEW COMPARISON:  07/19/2011. FINDINGS: The cardiac silhouette, mediastinal and hilar contours are upper limits of normal and stable. The lungs are clear. No pleural effusion. The bony thorax is intact. IMPRESSION: No acute cardiopulmonary findings. No change since prior chest film. Electronically Signed   By: Rudie MeyerP.  Gallerani M.D.   On: 04/20/2015 11:23   I have personally reviewed and evaluated these images and lab results as part of my medical decision-making.   EKG Interpretation   Date/Time:  Tuesday April 20 2015 10:00:13 EST Ventricular Rate:  73 PR Interval:  211 QRS Duration: 96 QT Interval:  420 QTC Calculation: 463 R Axis:     Text Interpretation:  Sinus rhythm Abnormal R-wave progression, early  transition Left ventricular hypertrophy Baseline wander in lead(s) II III  aVF Confirmed by Lincoln Brighamees, Liz [phone]([id number]) on 04/20/2015 10:09:00 AM      MDM   Final diagnoses:  Chest pain, unspecified chest pain type    Pt here for evaluation of waxing and waning chest pain, has multiple cardiac risk factors.  EKG without acute ischemic changes and pt without any active chest pain in department.  Presentation not c/w PE, cholecystitis, dissection.  Cardiology consulted for further cardiac work up.      Tilden FossaElizabeth Amro Winebarger, MD 04/20/15 [phone]

## 2015-04-20 NOTE — ED Notes (Signed)
Patient comes from work states she has been having intermitted Chest pain x2 weeks. States Chest pain started last night and has not went away. Patient states pain would radiates to jaw and back.  Patient states CP normally relieved  with 81mg  ASA. Patient was given 324 ASA and is now Chest pain free on arrival.

## 2015-04-20 NOTE — Progress Notes (Signed)
troponin + .30, pt asymptomatic. Cardiology made aware

## 2015-04-20 NOTE — ED Notes (Signed)
Attempted report to Burtis JunesJessica 3W RN unable to take.

## 2015-04-20 NOTE — H&P (Signed)
Patient ID: Danielle Fisher MRN: [id number], DOB/AGE: [birth year]   Admit date: 04/20/2015   Primary Physician: Evlyn Courier, MD Primary Cardiologist: New  Pt. Profile:  59 y/o female with h/o HTN, HLD and DM presenting with CP.   Problem List  Past Medical History  Diagnosis Date  . Hypertension   . Diabetes mellitus     History reviewed. No pertinent past surgical history.   Allergies  No Known Allergies  HPI  59 y/o female veteran. Spent 10 years in the [employer]. Receives the majority of her medical care at the [facility] hospital in [place]. She has multiple cardiac risk factors including poorly controlled IDDM, HTN and HLD. She notes her PCP checked her hgb A1c 2 weeks ago and was poorly controlled at 10. Home medicines include ASA, metoprolol, Crestor, insulin and metformin, for which she reports full compliance. She denies h/o tobacco use. No family h/o CAD or SCD. 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 There was no evidence of ischemia. EF was normal at 55%. She also reports undergoing a LHC, here at [facility], in 2006 that was normal, however I am unable to locate cath report.   She presents to the [facility] ED with a 1 week h/o intermittent CP. Described as sharp pain as well as pressure originating substernally and radiating to her back and both right and left jaw. She initially thought it was acid reflux but notes there has been very little relationship with meals. Her CP occurs at rest, all through the day, off and on. She notes some exacerbation with exertion. Also with mild DOE. She denies resting dyspnea. No orthopnea, PND or LEE. She also denies syncope/ near syncope. She has also had a nonproductive cough. She was seen at the [place] 1 week ago with same complaints. She noted they did a CXR that was fairly normal, except for the appearance of  an "enlarged heart". She was told that a consultation with a cardiologist for an echocardiogram would be made, but she has not yet been given an appointment. She states she was also given a Rx for an acid reflux medication, but she has not noticed any significant improvement with medication.   Last night, she had a recurrent episode of CP, 7/10,  that occurred at rest and lasted several hours but resolved spontaneously. Today, while at work, she had recurrent CP that resolved after taking 4 baby ASA. Given her persistent symptoms, she came to the ED for evaluation.   EKG shows NSR and is nonacute. POC troponin is negative. CXR is unremarkable. BMP shows PG level of 220 but otherwise is normal. CBC also normal. She is currently CP free.      Home Medications  Prior to Admission medications   Medication Sig Start Date End Date Taking? Authorizing Provider  aspirin EC 81 MG tablet Take 81 mg by mouth daily.   Yes Historical Provider, MD  chlorpheniramine (CHLOR-TRIMETON) 4 MG tablet Take 8 mg by mouth 2 (two) times daily as needed for allergies.   Yes Historical Provider, MD  Cholecalciferol (VITAMIN D PO) Take 1 tablet by mouth daily.   Yes Historical Provider, MD  insulin glargine (LANTUS) 100 UNIT/ML injection Inject 60 Units into the skin 2 (two) times daily.   Yes Historical Provider, MD  metFORMIN (GLUCOPHAGE) 500 MG tablet Take 1,000 mg by mouth  2 (two) times daily with a meal.    Yes Historical Provider, MD  metoprolol (LOPRESSOR) 50 MG tablet Take 50 mg by mouth 2 (two) times daily.   Yes Historical Provider, MD  PRESCRIPTION MEDICATION Take 1 tablet by mouth 2 (two) times daily. Acid reflux medication   Yes Historical Provider, MD  rosuvastatin (CRESTOR) 5 MG tablet Take 5 mg by mouth daily.   Yes Historical Provider, MD    Family History  Family History  Problem Relation Age of Onset  . Heart disease Mother   . Stroke Mother   . Breast cancer Sister   . Diabetes Sister   .  Stroke Father   . Diabetes Father   . Diabetes Sister     Social History  Social History   Social History  . Marital Status: Single    Spouse Name: N/A  . Number of Children: N/A  . Years of Education: N/A   Occupational History  . Logistics    Social History Main Topics  . Smoking status: Never Smoker   . Smokeless tobacco: Never Used  . Alcohol Use: No  . Drug Use: No  . Sexual Activity: Not on file   Other Topics Concern  . Not on file   Social History Narrative     Review of Systems General:  No chills, fever, night sweats or weight changes.  Cardiovascular:  No chest pain, dyspnea on exertion, edema, orthopnea, palpitations, paroxysmal nocturnal dyspnea. Dermatological: No rash, lesions/masses Respiratory: No cough, dyspnea Urologic: No hematuria, dysuria Abdominal:   No nausea, vomiting, diarrhea, bright red blood per rectum, melena, or hematemesis Neurologic:  No visual changes, wkns, changes in mental status. All other systems reviewed and are otherwise negative except as noted above.  Physical Exam  Blood pressure 143/79, pulse 74, temperature 97.8 F (36.6 C), temperature source Oral, resp. rate 22, SpO2 100 %.  General: Pleasant, NAD, moderately obese Psych: Normal affect. Neuro: Alert and oriented X 3. Moves all extremities spontaneously. HEENT: Normal  Neck: Supple without bruits or JVD. Lungs:  Resp regular and unlabored, CTA. Heart: RRR no s3, s4, or murmurs. Abdomen: Soft, non-tender, non-distended, BS + x 4.  Extremities: No clubbing, cyanosis or edema. DP/PT/Radials 2+ and equal bilaterally.  Labs  Troponin (Point of Care Test)  Recent Labs  04/20/15 1030  TROPIPOC 0.03   No results for input(s): CKTOTAL, CKMB, TROPONINI in the last 72 hours. Lab Results  Component Value Date   WBC 10.0 04/20/2015   HGB 12.4 04/20/2015   HCT 38.4 04/20/2015   MCV 83.7 04/20/2015   PLT 318 04/20/2015    Recent Labs Lab 04/20/15 1010  NA 137    K 4.5  CL 106  CO2 22  BUN 15  CREATININE 0.86  CALCIUM 10.3  PROT 7.2  BILITOT 1.0  ALKPHOS 162*  ALT 32  AST 35  GLUCOSE 220*   No results found for: CHOL, HDL, LDLCALC, TRIG No results found for: DDIMER   Radiology/Studies  Dg Chest 2 View  04/20/2015  CLINICAL DATA:  Intermittent chest pain for 2 weeks. EXAM: CHEST  2 VIEW COMPARISON:  07/19/2011. FINDINGS: The cardiac silhouette, mediastinal and hilar contours are upper limits of normal and stable. The lungs are clear. No pleural effusion. The bony thorax is intact. IMPRESSION: No acute cardiopulmonary findings. No change since prior chest film. Electronically Signed   By: Rudie Meyer M.D.   On: 04/20/2015 11:23    ECG  NSR. No acute  abnormalities    ASSESSMENT AND PLAN  1. Chest Pain: with mixed typical + atypical features. Also with multiple cardiac risk factors including poorly controlled IDDM, HTN, HLD and DM. W/u thus far is unremarkable. CXR is normal. EKG w/o ischemic abnormalities. POC troponin is negative. However, given her risk factors, recommend admission to observation to r/o MI. Would recommend stress test if negative enzymes. HR and BP currently well controlled. Continue ASA, statin, BB and DM meds. Consider checking a D-dimer.    Signed, SIMMONS, BRITTAINY, PA-C 04/20/2015, 12:35 PM  Attending Note:   The patient was seen and examined.  Agree with assessmRobbie Lisent and plan as noted above.  Changes made to the above note as needed.  Pt has symptoms that could be due to angina. We will admit for observation and cycle enzymes. Schedule Steffanie DunnLexiscan myoview for tomorrow    Vesta MixerPhilip J. Isauro Skelley, Montez HagemanJr., MD, [facility] 04/20/2015, 2:38 PM [address],  [address] Office [phone] Pager [phone]

## 2015-04-20 NOTE — ED Notes (Signed)
Called Main Lab and added Hepatic and Lipase to blood sent.

## 2015-04-21 ENCOUNTER — Encounter (HOSPITAL_COMMUNITY)
Admission: EM | Disposition: A | Discharge status: Home / Self Care | Payer: Self-pay | Source: Home / Self Care | Attending: Cardiovascular Disease

## 2015-04-21 DIAGNOSIS — E119 Type 2 diabetes mellitus without complications: Secondary | ICD-10-CM

## 2015-04-21 DIAGNOSIS — Z6835 Body mass index (BMI) 35.0-35.9, adult: Secondary | ICD-10-CM | POA: Diagnosis not present

## 2015-04-21 DIAGNOSIS — Z794 Long term (current) use of insulin: Secondary | ICD-10-CM

## 2015-04-21 DIAGNOSIS — E785 Hyperlipidemia, unspecified: Secondary | ICD-10-CM

## 2015-04-21 DIAGNOSIS — Z7982 Long term (current) use of aspirin: Secondary | ICD-10-CM | POA: Diagnosis not present

## 2015-04-21 DIAGNOSIS — R079 Chest pain, unspecified: Secondary | ICD-10-CM | POA: Diagnosis present

## 2015-04-21 DIAGNOSIS — I10 Essential (primary) hypertension: Secondary | ICD-10-CM

## 2015-04-21 DIAGNOSIS — Z79899 Other long term (current) drug therapy: Secondary | ICD-10-CM | POA: Diagnosis not present

## 2015-04-21 DIAGNOSIS — I214 Non-ST elevation (NSTEMI) myocardial infarction: Secondary | ICD-10-CM | POA: Diagnosis present

## 2015-04-21 DIAGNOSIS — E1165 Type 2 diabetes mellitus with hyperglycemia: Secondary | ICD-10-CM | POA: Diagnosis present

## 2015-04-21 DIAGNOSIS — E669 Obesity, unspecified: Secondary | ICD-10-CM | POA: Diagnosis present

## 2015-04-21 DIAGNOSIS — I2510 Atherosclerotic heart disease of native coronary artery without angina pectoris: Secondary | ICD-10-CM

## 2015-04-21 HISTORY — PX: CARDIAC CATHETERIZATION: SHX172

## 2015-04-21 LAB — HEPARIN LEVEL (UNFRACTIONATED)
HEPARIN UNFRACTIONATED: 0.29 [IU]/mL — AB (ref 0.30–0.70)
Heparin Unfractionated: 0.7 IU/mL (ref 0.30–0.70)

## 2015-04-21 LAB — CBC
HCT: 36.2 % (ref 36.0–46.0)
Hemoglobin: 11.7 g/dL — ABNORMAL LOW (ref 12.0–15.0)
MCH: 27 pg (ref 26.0–34.0)
MCHC: 32.3 g/dL (ref 30.0–36.0)
MCV: 83.6 fL (ref 78.0–100.0)
PLATELETS: 324 10*3/uL (ref 150–400)
RBC: 4.33 MIL/uL (ref 3.87–5.11)
RDW: 15.2 % (ref 11.5–15.5)
WBC: 10.8 10*3/uL — AB (ref 4.0–10.5)

## 2015-04-21 LAB — GLUCOSE, CAPILLARY
GLUCOSE-CAPILLARY: 100 mg/dL — AB (ref 65–99)
GLUCOSE-CAPILLARY: 62 mg/dL — AB (ref 65–99)
GLUCOSE-CAPILLARY: 50 mg/dL — AB (ref 65–99)
GLUCOSE-CAPILLARY: 52 mg/dL — AB (ref 65–99)
GLUCOSE-CAPILLARY: 80 mg/dL (ref 65–99)
Glucose-Capillary: 79 mg/dL (ref 65–99)
Glucose-Capillary: 152 mg/dL — ABNORMAL HIGH (ref 65–99)

## 2015-04-21 LAB — BASIC METABOLIC PANEL
Anion gap: 7 (ref 5–15)
BUN: 16 mg/dL (ref 6–20)
CALCIUM: 10 mg/dL (ref 8.9–10.3)
CO2: 24 mmol/L (ref 22–32)
CREATININE: 0.84 mg/dL (ref 0.44–1.00)
Chloride: 107 mmol/L (ref 101–111)
GFR calc non Af Amer: >60 mL/min (ref >60)
Glucose, Bld: 122 mg/dL — ABNORMAL HIGH (ref 65–99)
Potassium: 3.8 mmol/L (ref 3.5–5.1)
SODIUM: 138 mmol/L (ref 135–145)

## 2015-04-21 LAB — LIPID PANEL
CHOLESTEROL: 116 mg/dL (ref 0–200)
HDL: 33 mg/dL — ABNORMAL LOW (ref >40)
LDL CALC: 67 mg/dL (ref 0–99)
TRIGLYCERIDES: 80 mg/dL (ref <150)
Total CHOL/HDL Ratio: 3.5 RATIO
VLDL: 16 mg/dL (ref 0–40)

## 2015-04-21 LAB — POCT ACTIVATED CLOTTING TIME: ACTIVATED CLOTTING TIME: 219 s

## 2015-04-21 LAB — TROPONIN I: Troponin I: 0.45 ng/mL — ABNORMAL HIGH (ref <0.031)

## 2015-04-21 LAB — PROTIME-INR
INR: 1.11 (ref 0.00–1.49)
PROTHROMBIN TIME: 14.5 s (ref 11.6–15.2)

## 2015-04-21 SURGERY — LEFT HEART CATH AND CORONARY ANGIOGRAPHY

## 2015-04-21 MED ORDER — SODIUM CHLORIDE 0.9 % IJ SOLN: 3.0000 mL | INTRAMUSCULAR | Status: DC | PRN

## 2015-04-21 MED ORDER — SODIUM CHLORIDE 0.9 % IJ SOLN
3.0000 mL | Freq: Two times a day (BID) | INTRAMUSCULAR | Status: DC
  Administered 2015-04-21: 3 mL via INTRAVENOUS

## 2015-04-21 MED ORDER — HEART ATTACK BOUNCING BOOK
Freq: Once | Status: AC
  Administered 2015-04-21: 20:00:00
  Filled 2015-04-21: qty 1

## 2015-04-21 MED ORDER — NITROGLYCERIN 1 MG/10 ML FOR IR/CATH LAB
INTRA_ARTERIAL | Status: DC | PRN
  Administered 2015-04-21: 18:00:00

## 2015-04-21 MED ORDER — ANGIOPLASTY BOOK
Freq: Once | Status: AC
  Administered 2015-04-21: 20:00:00
  Filled 2015-04-21: qty 1

## 2015-04-21 MED ORDER — IOHEXOL 350 MG/ML SOLN
INTRAVENOUS | Status: DC | PRN
  Administered 2015-04-21: 135 mL via INTRAVENOUS

## 2015-04-21 MED ORDER — NITROGLYCERIN 1 MG/10 ML FOR IR/CATH LAB
INTRA_ARTERIAL | Status: DC | PRN
  Administered 2015-04-21 (×2): 200 ug

## 2015-04-21 MED ORDER — SODIUM CHLORIDE 0.9 % IV SOLN: 250.0000 mL | INTRAVENOUS | Status: DC | PRN

## 2015-04-21 MED ORDER — VERAPAMIL HCL 2.5 MG/ML IV SOLN
INTRAVENOUS | Status: AC
  Filled 2015-04-21: qty 2

## 2015-04-21 MED ORDER — ASPIRIN 81 MG PO CHEW
81.0000 mg | CHEWABLE_TABLET | ORAL | Status: AC
  Administered 2015-04-21: 81 mg via ORAL

## 2015-04-21 MED ORDER — HEPARIN SODIUM (PORCINE) 1000 UNIT/ML IJ SOLN
INTRAMUSCULAR | Status: DC | PRN
  Administered 2015-04-21: 4000 [IU] via INTRAVENOUS
  Administered 2015-04-21: 2000 [IU] via INTRAVENOUS

## 2015-04-21 MED ORDER — SODIUM CHLORIDE 0.9 % IV SOLN: INTRAVENOUS | Status: AC

## 2015-04-21 MED ORDER — LIDOCAINE HCL (PF) 1 % IJ SOLN
INTRAMUSCULAR | Status: AC
  Filled 2015-04-21: qty 30

## 2015-04-21 MED ORDER — MIDAZOLAM HCL 2 MG/2ML IJ SOLN
INTRAMUSCULAR | Status: AC
  Filled 2015-04-21: qty 2

## 2015-04-21 MED ORDER — FENTANYL CITRATE (PF) 100 MCG/2ML IJ SOLN
INTRAMUSCULAR | Status: DC | PRN
  Administered 2015-04-21: 50 ug via INTRAVENOUS

## 2015-04-21 MED ORDER — HEPARIN (PORCINE) IN NACL 2-0.9 UNIT/ML-% IJ SOLN
INTRAMUSCULAR | Status: AC
  Filled 2015-04-21: qty 1000

## 2015-04-21 MED ORDER — FENTANYL CITRATE (PF) 100 MCG/2ML IJ SOLN
INTRAMUSCULAR | Status: AC
  Filled 2015-04-21: qty 2

## 2015-04-21 MED ORDER — LOSARTAN POTASSIUM 25 MG PO TABS
25.0000 mg | ORAL_TABLET | Freq: Every day | ORAL | Status: DC
  Administered 2015-04-21: 25 mg via ORAL
  Filled 2015-04-21 (×3): qty 1

## 2015-04-21 MED ORDER — LIDOCAINE HCL (PF) 1 % IJ SOLN
INTRAMUSCULAR | Status: DC | PRN
  Administered 2015-04-21: 2 mL via INTRADERMAL

## 2015-04-21 MED ORDER — MIDAZOLAM HCL 2 MG/2ML IJ SOLN
INTRAMUSCULAR | Status: DC | PRN
  Administered 2015-04-21: 1 mg via INTRAVENOUS

## 2015-04-21 MED ORDER — TICAGRELOR 90 MG PO TABS
ORAL_TABLET | ORAL | Status: AC
  Filled 2015-04-21: qty 2

## 2015-04-21 MED ORDER — LIVING WELL WITH DIABETES BOOK
Freq: Once | Status: AC
  Administered 2015-04-21: 20:00:00
  Filled 2015-04-21: qty 1

## 2015-04-21 MED ORDER — SODIUM CHLORIDE 0.9 % IV SOLN
INTRAVENOUS | Status: DC
  Administered 2015-04-21: 10:00:00 via INTRAVENOUS

## 2015-04-21 MED ORDER — TICAGRELOR 90 MG PO TABS
ORAL_TABLET | ORAL | Status: DC | PRN
  Administered 2015-04-21: 180 mg via ORAL

## 2015-04-21 MED ORDER — HEPARIN (PORCINE) IN NACL 2-0.9 UNIT/ML-% IJ SOLN
INTRAMUSCULAR | Status: DC | PRN
  Administered 2015-04-21: 10 mL via INTRA_ARTERIAL

## 2015-04-21 MED ORDER — NITROGLYCERIN 1 MG/10 ML FOR IR/CATH LAB
INTRA_ARTERIAL | Status: AC
  Filled 2015-04-21: qty 10

## 2015-04-21 MED ORDER — TICAGRELOR 90 MG PO TABS
90.0000 mg | ORAL_TABLET | Freq: Two times a day (BID) | ORAL | Status: DC
  Administered 2015-04-22 (×2): 90 mg via ORAL
  Filled 2015-04-21 (×2): qty 1

## 2015-04-21 MED ORDER — SODIUM CHLORIDE 0.9 % IJ SOLN
3.0000 mL | Freq: Two times a day (BID) | INTRAMUSCULAR | Status: DC
  Administered 2015-04-22: 3 mL via INTRAVENOUS

## 2015-04-21 MED ORDER — HEPARIN SODIUM (PORCINE) 1000 UNIT/ML IJ SOLN
INTRAMUSCULAR | Status: AC
  Filled 2015-04-21: qty 1

## 2015-04-21 SURGICAL SUPPLY — 16 items
BALLN EUPHORA RX 2.0X12 (BALLOONS) ×2
BALLOON EUPHORA RX 2.0X12 (BALLOONS) ×1 IMPLANT
CATH INFINITI 5FR ANG PIGTAIL (CATHETERS) ×2 IMPLANT
CATH OPTITORQUE TIG 4.0 5F (CATHETERS) ×2 IMPLANT
DEVICE RAD COMP TR BAND LRG (VASCULAR PRODUCTS) ×2 IMPLANT
GLIDESHEATH SLEND SS 6F .021 (SHEATH) ×2 IMPLANT
GUIDE CATH RUNWAY 6FR FR4 (CATHETERS) ×2 IMPLANT
KIT ENCORE 26 ADVANTAGE (KITS) ×2 IMPLANT
KIT HEART LEFT (KITS) ×2 IMPLANT
PACK CARDIAC CATHETERIZATION (CUSTOM PROCEDURE TRAY) ×2 IMPLANT
STENT PROMUS PREM MR 2.5X16 (Permanent Stent) ×2 IMPLANT
SYR MEDRAD MARK V 150ML (SYRINGE) ×2 IMPLANT
TRANSDUCER W/STOPCOCK (MISCELLANEOUS) ×2 IMPLANT
TUBING CIL FLEX 10 FLL-RA (TUBING) ×2 IMPLANT
WIRE RUNTHROUGH .014X180CM (WIRE) ×2 IMPLANT
WIRE SAFE-T 1.5MM-J .035X260CM (WIRE) ×2 IMPLANT

## 2015-04-21 NOTE — Progress Notes (Signed)
TR BAND REMOVAL  LOCATION:    right radial  DEFLATED PER PROTOCOL:    Yes.    TIME BAND OFF / DRESSING APPLIED:    21:50   SITE UPON ARRIVAL:    Level 0  SITE AFTER BAND REMOVAL:    Level 0  CIRCULATION SENSATION AND MOVEMENT:    Within Normal Limits   Yes.    COMMENTS:

## 2015-04-21 NOTE — Plan of Care (Signed)
Problem: Consults Goal: Cardiac Cath Patient Education (See Patient Education module for education specifics.) Outcome: Completed/Met Date Met:  04/21/15 Pt educated on cardiac catheterization with the possibility of percutaneous intervention. Pt has had procedure in the past. Questions answered. No further questions.

## 2015-04-21 NOTE — Interval H&P Note (Signed)
History and Physical Interval Note:  04/21/2015 5:06 PM  Danielle Fisher  has presented today for surgery, with the diagnosis of cp  The various methods of treatment have been discussed with the patient and family. After consideration of risks, benefits and other options for treatment, the patient has consented to  Procedure(s): Left Heart Cath and Coronary Angiography (N/A) as a surgical intervention .  The patient's history has been reviewed, patient examined, no change in status, stable for surgery.  I have reviewed the patient's chart and labs.  Questions were answered to the patient's satisfaction.     Lorine BearsMuhammad Arida

## 2015-04-21 NOTE — H&P (View-Only) (Signed)
Patient Name: Danielle RichmondKathleen Fisher Date of Encounter: 04/21/2015  Active Problems:   NSTEMI (non-ST elevated myocardial infarction) [facility](HCC)   Chest pain with moderate risk for cardiac etiology   HTN (hypertension)   HLD (hyperlipidemia)    Primary Cardiologist: New - Firmin Belisle Patient Profile: 59 yo female w/ PMH of IDDM, HTN, and HLD who [facility] for intermittent chest pain over the past week. Troponin values elevated to peak of 0.45. LHC planned for today.  SUBJECTIVE: Denies any chest pain or shortness of breath overnight. Was originally suppose to have a NST today with with elevated troponin values, will plan for LHC. Discussed this with the patient.  OBJECTIVE Filed Vitals:   04/20/15 1515 04/20/15 1600 04/20/15 2006 04/21/15 0508  BP:  145/88 161/87 138/65  Pulse: 73  84 68  Temp:   98.2 F (36.8 C) 98 F (36.7 C)  TempSrc:   Oral Oral  Resp: 16  18 18   Height:  5\' 7"  (1.702 m)    Weight:  238 lb (107.956 kg)  238 lb 9.6 oz (108.228 kg)  SpO2: 99% 99% 99% 97%    Intake/Output Summary (Last 24 hours) at 04/21/15 0858 Last data filed at 04/21/15 0700  Gross per 24 hour  Intake 851.36 ml  Output    650 ml  Net 201.36 ml   Filed Weights   04/20/15 1600 04/21/15 0508  Weight: 238 lb (107.956 kg) 238 lb 9.6 oz (108.228 kg)    PHYSICAL EXAM General: Well developed, well nourished, African American female in no acute distress. Head: Normocephalic, atraumatic.  Neck: Supple without bruits, JVD not elevated. Lungs:  Resp regular and unlabored, CTA without wheezing or rales. Heart: RRR, S1, S2, no S3, S4, or murmur; no rub. Abdomen: Soft, non-tender, non-distended with normoactive bowel sounds. No hepatomegaly. No rebound/guarding. No obvious abdominal masses. Extremities: No clubbing, cyanosis, or edema. Distal pedal pulses are 2+ bilaterally. Neuro: Alert and oriented X 3. Moves all extremities spontaneously. Psych: Normal  affect.   LABS: CBC:  Recent Labs  04/20/15 1635 04/21/15 0427  WBC 10.2 10.8*  HGB 13.1 11.7*  HCT 40.3 36.2  MCV 82.9 83.6  PLT 357 324   INR:No results for input(s): INR in the last 72 hours. Basic Metabolic Panel:  Recent Labs  82/95/6212/13/16 1010 04/20/15 1635 04/21/15 0427  NA 137  --  138  K 4.5  --  3.8  CL 106  --  107  CO2 22  --  24  GLUCOSE 220*  --  122*  BUN 15  --  16  CREATININE 0.86 0.78 0.84  CALCIUM 10.3  --  10.0   Liver Function Tests:  Recent Labs  04/20/15 1010  AST 35  ALT 32  ALKPHOS 162*  BILITOT 1.0  PROT 7.2  ALBUMIN 3.5   Cardiac Enzymes:  Recent Labs  04/20/15 1635 04/20/15 2214 04/21/15 0427  TROPONINI 0.30* 0.45* 0.45*    Recent Labs  04/20/15 1030  TROPIPOC 0.03    TELE:  NSR with rate in 60's - 80's.     Radiology/Studies:  Dg Chest 2 View:04/20/2015  CLINICAL DATA:  Intermittent chest pain for 2 weeks. EXAM: CHEST  2 VIEW COMPARISON:  07/19/2011. FINDINGS: The cardiac silhouette, mediastinal and hilar contours are upper limits of normal and stable. The lungs are clear. No pleural effusion. The bony thorax is intact. IMPRESSION: No acute cardiopulmonary findings. No change since prior chest film. Electronically Signed  By: P.  Gallerani M.D.   On: 04/20/2015 11:23    Current Medications:  . aspirin EC  81 mg Oral Daily  . insulin aspart  0-15 Units Subcutaneous TID WC  . metFORMIN  1,000 mg Oral BID WC  . metoprolol  50 mg Oral BID  . regadenoson  0.4 mg Intravenous Once  . rosuvastatin  5 mg Oral q1800   . heparin 1,400 Units/hr (04/21/15 0655)    ASSESSMENT AND PLAN:  1. NSTEMI (non-ST elevated myocardial infarction) (HCC) - presented with a 1 week history of intermittent chest pain and mild dyspnea on exertion. - troponin values have been 0.30, 0.45, and 0.45. With rise troponin and her symptoms, will proceed with cardiac cath today  I've explained the risks, benefits, options.  She understands and  agrees to proceed.   2. IDDM - Hgb A1c was elevated to 10.0 two weeks ago according to the patient. - Was still receiving Metformin. Will hold for cath today and 48 hours afterwards  3. HTN (hypertension) - BP has been 134/59 - 151/88 in the past 24 hours. - Continue BB. Consider addition of ACE-I pending cath results.  4. HLD (hyperlipidemia) - continue statin Check lipid profile this am      Nga Rabon J, MD  04/21/2015 9:40 AM    [facility] [address],  [address] [place], [place]  [address] Pager [phone] Phone: [phone]; Fax: [phone]   [place] Office  [address] [place], [place]  [address] [phone]   Fax [phone]  

## 2015-04-21 NOTE — Progress Notes (Signed)
Patient Name: Danielle Fisher Date of Encounter: 04/21/2015  Active Problems:   NSTEMI (non-ST elevated myocardial infarction) [facility](HCC)   Chest pain with moderate risk for cardiac etiology   HTN (hypertension)   HLD (hyperlipidemia)    Primary Cardiologist: New - Domnique Vanegas Patient Profile: 59 yo female w/ PMH of IDDM, HTN, and HLD who [facility]/2015 for intermittent chest pain over the past week. Troponin values elevated to peak of 0.45. LHC planned for today.  SUBJECTIVE: Denies any chest pain or shortness of breath overnight. Was originally suppose to have a NST today with with elevated troponin values, will plan for LHC. Discussed this with the patient.  OBJECTIVE Filed Vitals:   04/20/15 1515 04/20/15 1600 04/20/15 2006 04/21/15 0508  BP:  145/88 161/87 138/65  Pulse: 73  84 68  Temp:   98.2 F (36.8 C) 98 F (36.7 C)  TempSrc:   Oral Oral  Resp: 16  18 18   Height:  5\' 7"  (1.702 m)    Weight:  238 lb (107.956 kg)  238 lb 9.6 oz (108.228 kg)  SpO2: 99% 99% 99% 97%    Intake/Output Summary (Last 24 hours) at 04/21/15 0858 Last data filed at 04/21/15 0700  Gross per 24 hour  Intake 851.36 ml  Output    650 ml  Net 201.36 ml   Filed Weights   04/20/15 1600 04/21/15 0508  Weight: 238 lb (107.956 kg) 238 lb 9.6 oz (108.228 kg)    PHYSICAL EXAM General: Well developed, well nourished, African American female in no acute distress. Head: Normocephalic, atraumatic.  Neck: Supple without bruits, JVD not elevated. Lungs:  Resp regular and unlabored, CTA without wheezing or rales. Heart: RRR, S1, S2, no S3, S4, or murmur; no rub. Abdomen: Soft, non-tender, non-distended with normoactive bowel sounds. No hepatomegaly. No rebound/guarding. No obvious abdominal masses. Extremities: No clubbing, cyanosis, or edema. Distal pedal pulses are 2+ bilaterally. Neuro: Alert and oriented X 3. Moves all extremities spontaneously. Psych: Normal  affect.   LABS: CBC:  Recent Labs  04/20/15 1635 04/21/15 0427  WBC 10.2 10.8*  HGB 13.1 11.7*  HCT 40.3 36.2  MCV 82.9 83.6  PLT 357 324   INR:No results for input(s): INR in the last 72 hours. Basic Metabolic Panel:  Recent Labs  82/95/6212/13/16 1010 04/20/15 1635 04/21/15 0427  NA 137  --  138  K 4.5  --  3.8  CL 106  --  107  CO2 22  --  24  GLUCOSE 220*  --  122*  BUN 15  --  16  CREATININE 0.86 0.78 0.84  CALCIUM 10.3  --  10.0   Liver Function Tests:  Recent Labs  04/20/15 1010  AST 35  ALT 32  ALKPHOS 162*  BILITOT 1.0  PROT 7.2  ALBUMIN 3.5   Cardiac Enzymes:  Recent Labs  04/20/15 1635 04/20/15 2214 04/21/15 0427  TROPONINI 0.30* 0.45* 0.45*    Recent Labs  04/20/15 1030  TROPIPOC 0.03    TELE:  NSR with rate in 60's - 80's.     Radiology/Studies:  Dg Chest 2 View:04/20/2015  CLINICAL DATA:  Intermittent chest pain for 2 weeks. EXAM: CHEST  2 VIEW COMPARISON:  07/19/2011. FINDINGS: The cardiac silhouette, mediastinal and hilar contours are upper limits of normal and stable. The lungs are clear. No pleural effusion. The bony thorax is intact. IMPRESSION: No acute cardiopulmonary findings. No change since prior chest film. Electronically Signed  By: Rudie Meyer M.D.   On: 04/20/2015 11:23    Current Medications:  . aspirin EC  81 mg Oral Daily  . insulin aspart  0-15 Units Subcutaneous TID WC  . metFORMIN  1,000 mg Oral BID WC  . metoprolol  50 mg Oral BID  . regadenoson  0.4 mg Intravenous Once  . rosuvastatin  5 mg Oral q1800   . heparin 1,400 Units/hr (04/21/15 0655)    ASSESSMENT AND PLAN:  1. NSTEMI (non-ST elevated myocardial infarction) (HCC) - presented with a 1 week history of intermittent chest pain and mild dyspnea on exertion. - troponin values have been 0.30, 0.45, and 0.45. With rise troponin and her symptoms, will proceed with cardiac cath today  I've explained the risks, benefits, options.  She understands and  agrees to proceed.   2. IDDM - Hgb A1c was elevated to 10.0 two weeks ago according to the patient. - Was still receiving Metformin. Will hold for cath today and 48 hours afterwards  3. HTN (hypertension) - BP has been 134/59 - 151/88 in the past 24 hours. - Continue BB. Consider addition of ACE-I pending cath results.  4. HLD (hyperlipidemia) - continue statin Check lipid profile this am      Fatim Vanderschaaf, [name], MD  04/21/2015 9:40 AM    [facility] [address] [place],  [address] [place], [place]  [address] Pager [phone] Phone: [phone]; Fax: [phone]   [facility]  [address] [place], [place]  [address] [phone]   Fax [phone]

## 2015-04-21 NOTE — Progress Notes (Signed)
ANTICOAGULATION CONSULT NOTE - Follow-up Consult  Pharmacy Consult for Heparin Indication: chest pain/ACS  No Known Allergies  Patient Measurements: Height: 5\' 7"  (170.2 cm) Weight: 238 lb 9.6 oz (108.228 kg) IBW/kg (Calculated) : 61.6 Heparin Dosing Weight: 86.3 kg  Vital Signs: Temp: 98.5 F (36.9 C) (12/14 1449) Temp Source: Oral (12/14 1449) BP: 132/60 mmHg (12/14 1449) Pulse Rate: 74 (12/14 1449)  Labs:  Recent Labs  04/20/15 1010 04/20/15 1635 04/20/15 2214 04/21/15 0427 04/21/15 1026 04/21/15 1316  HGB 12.4 13.1  --  11.7*  --   --   HCT 38.4 40.3  --  36.2  --   --   PLT 318 357  --  324  --   --   LABPROT  --   --   --   --  14.5  --   INR  --   --   --   --  1.11  --   HEPARINUNFRC  --   --   --  0.29*  --  0.70  CREATININE 0.86 0.78  --  0.84  --   --   TROPONINI  --  0.30* 0.45* 0.45*  --   --     Estimated Creatinine Clearance: 89 mL/min (by C-G formula based on Cr of 0.84).  Assessment: CP 59 y.o. F on heparin for NSTEMI. Trop up to 0.45. Likely for cath today. Heparin level slightly subtherapeutic on 1200 units/hr. Hgb down to 11.7, plt ok. No issues with line or bleeding reported per RN.  Goal of Therapy:  Heparin level 0.3-0.7 units/ml Monitor platelets by anticoagulation protocol: Yes   Plan:  Increase heparin to 1400 units/hr F/u heparin in 6 hours  Christoper Fabianaron Amend, PharmD, BCPS Clinical pharmacist, pager [phone] 04/21/2015,3:07 PM   Addendum -HL therapeutic at high end of range -Reduce heparin gtt to 1350 units/hr -Daily HL, CBC -Monitor s/sx bleeding -F/u after cath today  Baldemar FridayMasters, Vernon Maish M  04/21/2015 .3:07 PM

## 2015-04-21 NOTE — Progress Notes (Signed)
ANTICOAGULATION CONSULT NOTE - Follow-up Consult  Pharmacy Consult for Heparin Indication: chest pain/ACS  No Known Allergies  Patient Measurements: Height: 5\' 7"  (170.2 cm) Weight: 238 lb 9.6 oz (108.228 kg) IBW/kg (Calculated) : 61.6 Heparin Dosing Weight: 86.3 kg  Vital Signs: Temp: 98 F (36.7 C) (12/14 0508) Temp Source: Oral (12/14 0508) BP: 138/65 mmHg (12/14 0508) Pulse Rate: 68 (12/14 0508)  Labs:  Recent Labs  04/20/15 1010 04/20/15 1635 04/20/15 2214 04/21/15 0427  HGB 12.4 13.1  --  11.7*  HCT 38.4 40.3  --  36.2  PLT 318 357  --  324  HEPARINUNFRC  --   --   --  0.29*  CREATININE 0.86 0.78  --  0.84  TROPONINI  --  0.30* 0.45* 0.45*    Estimated Creatinine Clearance: 89 mL/min (by C-G formula based on Cr of 0.84).  Assessment: Danielle Fisher 59 y.o. F on heparin for NSTEMI. Trop up to 0.45. Likely for cath today. Heparin level slightly subtherapeutic on 1200 units/hr. Hgb down to 11.7, plt ok. No issues with line or bleeding reported per RN.  Goal of Therapy:  Heparin level 0.3-0.7 units/ml Monitor platelets by anticoagulation protocol: Yes   Plan:  Increase heparin to 1400 units/hr F/u heparin in 6 hours  Christoper Fabianaron Agnes Probert, PharmD, BCPS Clinical pharmacist, pager [phone] 04/21/2015,6:25 AM

## 2015-04-22 ENCOUNTER — Telehealth: Payer: Self-pay | Admitting: Cardiovascular Disease

## 2015-04-22 ENCOUNTER — Encounter (HOSPITAL_COMMUNITY): Payer: Self-pay | Admitting: Cardiovascular Disease

## 2015-04-22 DIAGNOSIS — E669 Obesity, unspecified: Secondary | ICD-10-CM

## 2015-04-22 DIAGNOSIS — I2510 Atherosclerotic heart disease of native coronary artery without angina pectoris: Secondary | ICD-10-CM

## 2015-04-22 DIAGNOSIS — Z9861 Coronary angioplasty status: Secondary | ICD-10-CM

## 2015-04-22 LAB — CBC
HCT: 37.2 % (ref 36.0–46.0)
Hemoglobin: 12.2 g/dL (ref 12.0–15.0)
MCH: 27.3 pg (ref 26.0–34.0)
MCHC: 32.8 g/dL (ref 30.0–36.0)
MCV: 83.2 fL (ref 78.0–100.0)
Platelets: 326 K/uL (ref 150–400)
RBC: 4.47 MIL/uL (ref 3.87–5.11)
RDW: 15.2 % (ref 11.5–15.5)
WBC: 9.7 K/uL (ref 4.0–10.5)

## 2015-04-22 LAB — GLUCOSE, CAPILLARY
Glucose-Capillary: 126 mg/dL — ABNORMAL HIGH (ref 65–99)
Glucose-Capillary: 206 mg/dL — ABNORMAL HIGH (ref 65–99)

## 2015-04-22 LAB — BASIC METABOLIC PANEL
Anion gap: 7 (ref 5–15)
BUN: 11 mg/dL (ref 6–20)
CALCIUM: 10.1 mg/dL (ref 8.9–10.3)
CO2: 25 mmol/L (ref 22–32)
CREATININE: 0.78 mg/dL (ref 0.44–1.00)
Chloride: 106 mmol/L (ref 101–111)
GFR calc non Af Amer: >60 mL/min (ref >60)
Glucose, Bld: 147 mg/dL — ABNORMAL HIGH (ref 65–99)
Potassium: 3.9 mmol/L (ref 3.5–5.1)
Sodium: 138 mmol/L (ref 135–145)

## 2015-04-22 LAB — HEPARIN LEVEL (UNFRACTIONATED): Heparin Unfractionated: <0.1 [IU]/mL — ABNORMAL LOW (ref 0.30–0.70)

## 2015-04-22 MED ORDER — LOSARTAN POTASSIUM 50 MG PO TABS
50.0000 mg | ORAL_TABLET | Freq: Every day | ORAL | Status: DC
  Administered 2015-04-22: 50 mg via ORAL
  Filled 2015-04-22: qty 1

## 2015-04-22 MED ORDER — NITROGLYCERIN 0.4 MG SL SUBL: 0.4000 mg | SUBLINGUAL_TABLET | SUBLINGUAL | Status: AC | PRN

## 2015-04-22 MED ORDER — ROSUVASTATIN CALCIUM 20 MG PO TABS: 20.0000 mg | ORAL_TABLET | Freq: Every evening | ORAL | Status: DC

## 2015-04-22 MED ORDER — TICAGRELOR 90 MG PO TABS: 90.0000 mg | ORAL_TABLET | Freq: Two times a day (BID) | ORAL | Status: DC

## 2015-04-22 MED ORDER — LOSARTAN POTASSIUM 50 MG PO TABS: 50.0000 mg | ORAL_TABLET | Freq: Every day | ORAL | Status: DC

## 2015-04-22 MED ORDER — ROSUVASTATIN CALCIUM 20 MG PO TABS: 20.0000 mg | ORAL_TABLET | Freq: Every day | ORAL | Status: DC

## 2015-04-22 NOTE — Progress Notes (Signed)
CARDIAC REHAB PHASE I   PRE:  Rate/Rhythm: 84 SR  BP:  Supine:   Sitting: 175/85  Standing:    SaO2:   MODE:  Ambulation: 500 ft   POST:  Rate/Rhythm: 107-116 ST  BP:  Supine:   Sitting: 196/3  Standing:    SaO2:  0755-0850 Pt walked 500 ft with steady gait. Tolerated well. No CP. Discussed carb counting and trying to get diabetes better controlled. Stressed importance of brilinta with stent. Needs to see case manager for card. Discussed NTG use, MI restrictions, benefits of ex, risk factors and CRP 2. Will refer to [place] CRP 2.   Luetta Nuttingharlene Kyliee Ortego, RN BSN  04/22/2015 8:45 AM

## 2015-04-22 NOTE — Progress Notes (Signed)
CM provided pt with Brilinta booklet with 30 day free and copay card enclosed. [facility] pharmacy called and confirmed medication is in stock. Benefit check : BRILINTA 90 MG BID 30 DAY SUPPLY   COVER- YES  CO-PAY- $ 50.00  PRIOR APPROVAL - NO  PHARMACY - [facility]  , information shared with pt per CM. Gae Gallopngela Chinonso Linker RN,BSN,CM [phone]

## 2015-04-22 NOTE — Discharge Summary (Signed)
Discharge Summary   Patient ID: Danielle Fisher,  MRN: [id number], DOB/AGE: 1955-10-11 59 y.o.  Admit date: 04/20/2015 Discharge date: 04/22/2015  Primary Care Provider: Pcp Not In [place] Primary Cardiologist: Dr. Acie Fredrickson  Discharge Diagnoses    Principal Problem:   NSTEMI (non-ST elevated myocardial infarction) [facility]) Active Problems:   CAD S/P percutaneous coronary angioplasty - LHC 04/21/15: s/p DES to distal RCA; residual moderate ostial rPDA but vessel is overall small; EF 55-65%   HTN (hypertension)   HLD (hyperlipidemia)   Diabetes mellitus, insulin dependent (IDDM), uncontrolled (HCC)   Obesity   Allergies No Known Allergies  Diagnostic Studies/Procedures    Cardiac catheterization this admission, please see full report and above for summary. _____________   Hospital Course   Danielle Fisher is a 59 y/o F with history of HTN, HLD, DM who presented to [facility] 04/20/2015 with chest pain. She receives the majority of her medical care at the [facility] hospital in [place] - was in [employer] for 10 years. A1C two weeks ago by PCP was 10. She reported full compliance with medication regimen. She has no h/o tobacco abuse or family history of CAD. She presented to the hospital with 1 week history of intermittent chest pain described as as sharp pain as well as pressure originating substernally and radiating to her back and both right and left jaw. She was seen at the [place] 1 week ago with same complaints. She noted they did a CXR that was fairly normal, except for the appearance of an "enlarged heart." She was told that a consultation with a cardiologist for an echocardiogram would be made, but she has not yet been given an appointment. She states she was also given a Rx for an acid reflux medication, but she has not noticed any significant improvement with medication. Due to recurrence of sx she sought care at [facility] where initial troponin was negative. EKG showed NSR  and was nonacute. She was admitted for further evaluation. Initial plan was for stress testing but this was cancelled as she ultimately ruled in for NSTEMI with troponin of 0.45. She underwent LHC 04/21/15: s/p DES to distal RCA; residual moderate ostial rPDA but vessel is overall small; EF 55-65%, moderately to severely elevated systemic pressure with moderately elevated left ventricular end-diastolic pressure. Losartan was added for improved BP control - further titrated this AM due to high BP. Crestor was titrated to 79m daily. She tolerated cath well. Due to stressful job and lots of typing we discussed returning to work and she was cleared to return in 2 weeks. She was asked to hold her Metformin 48 hr post-cath. Dr. NAcie Fredricksonhas seen and examined the patient today and feels she is stable for discharge.  With regard to f/u, would consider: - recheck BMET at f/u given initiation of ACEI - If the patient is tolerating statin at time of follow-up appointment, would consider rechecking liver function/lipid panel in 6-8 weeks. Note alk phos was mildly elevated on baseline LFTs and will be f/u. - if [place] order this at f/u appt.  Consultants: N/A _____________  Discharge Vitals Blood pressure 175/85, pulse 82, temperature 98 F (36.7 C), temperature source Oral, resp. rate 17, height 5' 7"  (1.702 m), weight 227 lb 8.2 oz (103.2 kg), SpO2 98 %.  Filed Weights   04/20/15 1600 04/21/15 0508 04/22/15 0427  Weight: 238 lb (107.956 kg) 238 lb 9.6 oz (108.228 kg)  227 lb 8.2 oz (103.2 kg)   _____________  Labs     CBC  Recent Labs  04/21/15 0427 04/22/15 0549  WBC 10.8* 9.7  HGB 11.7* 12.2  HCT 36.2 37.2  MCV 83.6 83.2  PLT 324 536   Basic Metabolic Panel  Recent Labs  04/21/15 0427 04/22/15 0549  NA 138 138  K 3.8 3.9  CL 107 106  CO2 24 25  GLUCOSE 122* 147*  BUN 16 11  CREATININE 0.84 0.78  CALCIUM 10.0 10.1   Liver Function Tests  Recent  Labs  04/20/15 1010  AST 35  ALT 32  ALKPHOS 162*  BILITOT 1.0  PROT 7.2  ALBUMIN 3.5    Recent Labs  04/20/15 1010  LIPASE 31   Cardiac Enzymes  Recent Labs  04/20/15 1635 04/20/15 2214 04/21/15 0427  TROPONINI 0.30* 0.45* 0.45*   Fasting Lipid Panel  Recent Labs  04/21/15 1026  CHOL 116  HDL 33*  LDLCALC 67  TRIG 80  CHOLHDL 3.5   _____________  Disposition   Pt is being discharged home today in good condition. _____________  Follow-up Plans & Appointments    Follow-up Information    Follow up with Charlie Pitter, PA-C.   Specialties:  Cardiology, Radiology   Why:  [facility] - 04/29/15 at 8am.   Contact information:   [address] [place] [address] [phone]      Discharge Instructions    Diet - low sodium heart healthy    Complete by:  As directed   Diabetic Diet     Increase activity slowly    Complete by:  As directed   No driving for 1 week. No lifting over 10 lbs for 2 weeks. No sexual activity for 2 weeks. You may return to work on 05/06/15. Keep procedure site clean & dry. If you notice increased pain, swelling, bleeding or pus, call/return!  You may shower, but no soaking baths/hot tubs/pools for 1 week.   Please monitor your blood pressure occasionally at home. Call your doctor if you tend to get readings of greater than 130 on the top number or 80 on the bottom number.  Metformin has to be held for 48 hours after a heart catheterization - you may resume your Metformin tomorrow night (04/23/15).  New medicines include Brilinta, losartan, and nitroglycerin. Your rosuvastatin dose has changed.           Discharge Medications   Current Discharge Medication List    START taking these medications   Details  losartan (COZAAR) 50 MG tablet Take 1 tablet (50 mg total) by mouth daily. Qty: 30 tablet, Refills: 6    nitroGLYCERIN (NITROSTAT) 0.4 MG SL tablet Place 1 tablet (0.4 mg total) under the tongue  every 5 (five) minutes as needed for chest pain (up to 3 doses). Qty: 25 tablet, Refills: 3    ticagrelor (BRILINTA) 90 MG TABS tablet Take 1 tablet (90 mg total) by mouth 2 (two) times daily. Qty: 60 tablet, Refills: 11      CONTINUE these medications which have CHANGED   Details  rosuvastatin (CRESTOR) 20 MG tablet Take 1 tablet (20 mg total) by mouth every evening. Qty: 30 tablet, Refills: 6      CONTINUE these medications which have NOT CHANGED   Details  aspirin EC 81 MG tablet Take 81 mg by mouth daily.    chlorpheniramine (CHLOR-TRIMETON) 4 MG tablet Take 8 mg by mouth 2 (two) times  daily as needed for allergies.    Cholecalciferol (VITAMIN D PO) Take 1 tablet by mouth daily.    insulin glargine (LANTUS) 100 UNIT/ML injection Inject 60 Units into the skin 2 (two) times daily.    metFORMIN (GLUCOPHAGE) 500 MG tablet Take 1,000 mg by mouth 2 (two) times daily with a meal.     metoprolol (LOPRESSOR) 50 MG tablet Take 50 mg by mouth 2 (two) times daily.    PRESCRIPTION MEDICATION Take 1 tablet by mouth 2 (two) times daily. Acid reflux medication       Next to Metformin on d/c paperwork in two places - Notes to Patient: Metformin has to be held for 48 hours after a heart catheterization - you may resume your Metformin tomorrow night (04/23/15).  Aspirin prescribed at discharge:  Yes High Intensity Statin Prescribed? (Lipitor 40-3m or Crestor 20-464m: Yes Beta Blocker Prescribed: Yes ADP Receptor Inhibitor Prescribed? (i.e. Plavix etc.-Includes Medically Managed Patients): Yes For EF <40%, Aldosterone Inhibitor Prescribed? No: EF >40% Was EF assessed during THIS hospitalization? Yes Was Cardiac Rehab II ordered? (Included Medically managed Patients): Yes _____________   Outstanding Labs/Studies   As above  Duration of Discharge Encounter   Greater than 30 minutes including physician time.  Signed, DaMelina CopaA-C 04/22/2015, 8:24 AM  Attending Note:   The  patient was seen and examined.  Agree with assessment and plan as noted above.  Changes made to the above note as needed.  Doing well after PCI DAPT for 1 year  [place] [facility] [place]    PhThayer HeadingsJrBrooke Bonito MD, [facility]2/15/2016, 8:31 AM [address] [place] [address] [phone]

## 2015-04-22 NOTE — Progress Notes (Signed)
Patient: Danielle Fisher / Admit Date: 04/20/2015 / Date of Encounter: 04/22/2015, 7:50 AM   Subjective: Feeling great. No CP or SOB.   Objective: Telemetry: NSR Physical Exam: Blood pressure 147/64, pulse 76, temperature 98.1 F (36.7 C), temperature source Oral, resp. rate 14, height  (1.702 m), weight 227 lb 8.2 oz (103.2 kg), SpO2 98 %. General: Well developed, well nourished obese AAF in no acute distress. Head: Normocephalic, atraumatic, sclera non-icteric, no xanthomas, nares are without discharge. Neck: Negative for carotid bruits. JVP not elevated. Lungs: Clear bilaterally to auscultation without wheezes, rales, or rhonchi. Breathing is unlabored. Heart: RRR S1 S2 without murmurs, rubs, or gallops.  Abdomen: Soft, non-tender, non-distended with normoactive bowel sounds. No rebound/guarding. Extremities: No clubbing or cyanosis. No edema. Distal pedal pulses are 2+ and equal bilaterally. Right radial cath site with mild ecchymosis, good pulse. Neuro: Alert and oriented X 3. Moves all extremities spontaneously. Psych:  Responds to questions appropriately with a normal affect.   Intake/Output Summary (Last 24 hours) at 04/22/15 0750 Last data filed at 04/22/15 0455  Gross per 24 hour  Intake 1609.7 ml  Output   2401 ml  Net -791.3 ml    Inpatient Medications:  . aspirin EC  81 mg Oral Daily  . insulin aspart  0-15 Units Subcutaneous TID WC  . losartan  25 mg Oral Daily  . metoprolol  50 mg Oral BID  . rosuvastatin  5 mg Oral q1800  . sodium chloride  3 mL Intravenous Q12H  . ticagrelor  90 mg Oral BID   Infusions:    Labs:  Recent Labs  04/21/15 0427 04/22/15 0549  NA 138 138  K 3.8 3.9  CL 107 106  CO2 24 25  GLUCOSE 122* 147*  BUN 16 11  CREATININE 0.84 0.78  CALCIUM 10.0 10.1    Recent Labs  04/20/15 1010  AST 35  ALT 32  ALKPHOS 162*  BILITOT 1.0  PROT 7.2  ALBUMIN 3.5    Recent Labs  04/21/15 0427 04/22/15 0549  WBC 10.8* 9.7   HGB 11.7* 12.2  HCT 36.2 37.2  MCV 83.6 83.2  PLT 324 326    Recent Labs  04/20/15 1635 04/20/15 2214 04/21/15 0427  TROPONINI 0.30* 0.45* 0.45*   Invalid input(s): POCBNP No results for input(s): HGBA1C in the last 72 hours.   Radiology/Studies:  Dg Chest 2 View  04/20/2015  CLINICAL DATA:  Intermittent chest pain for 2 weeks. EXAM: CHEST  2 VIEW COMPARISON:  07/19/2011. FINDINGS: The cardiac silhouette, mediastinal and hilar contours are upper limits of normal and stable. The lungs are clear. No pleural effusion. The bony thorax is intact. IMPRESSION: No acute cardiopulmonary findings. No change since prior chest film. Electronically Signed   By: Rudie Meyer M.D.   On: 04/20/2015 11:23     Assessment and Plan  59F with IDDM, HTN, HLD presented with CP and NSTEMI. LHC 04/21/15: s/p DES to distal RCA; residual moderate ostial rPDA but vessel is overall small; EF 55-65%.  1. CAD/NSTEMI - s/p DES as above. Continue DAPT x 12 months.  2. HTN - losartan added yesterday for elevated BP. Based on BPs may need to titrate to  daily - will d/w MD.  3. HLD - increase Crestor to  qpm.  4. DM - continue home regimen at d/c except hold metformin x 48 hr post cath.  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  SignedRonie Spies, Dayna Dunn PA-C Pager: [phone]  Attending Note:   The patient was seen and examined.  Agree with assessment and plan as noted above.  Changes made to the above note as needed.  Doing well after PCI DAPT for 1 year  [facility] months   Vesta MixerPhilip J. Jaedan Schuman, Montez HagemanJr., MD, [facility] ArkansasFACC 04/22/2015, 8:30 AM [address],  [address] Office [phone] Pager [phone]- [phone]

## 2015-04-22 NOTE — Telephone Encounter (Signed)
New message   TOC appt on  12.22.2016 @ 8 am with Ronie Spiesayna Dunn .

## 2015-04-23 NOTE — Telephone Encounter (Signed)
[facility].  Patient understands to follow up with provider Jarome Lamasayna Dunn,PA on 04/29/15 [facility] 8:[facility] Patient understands discharge instructions? yes Patient understands medications and regiment? yes Patient understands to bring all medications to this visit? yes  States she is doing good since D/C.  No c/o of CP or SOB.  States her (R) wrist (cath site) is not red or swollen.  Is gradually increasing her activity.  Reviewed her medications.  No questions.  States she was able to get all of her medications last PM. Advised to bring all of her medications to office visit on 12/22. She verbalizes understanding and will call if has any questions or concerns.

## 2015-04-28 NOTE — Progress Notes (Signed)
Cardiology Office Note Date:  04/29/2015  Patient ID:  Danielle, Fisher 08-02-1955, MRN [id number] PCP:  Pcp Not In System  Cardiologist:  Nahser  Chief Complaint: f/u NSTEMI  History of Present Illness: Danielle Fisher is a 59 y.o. female with history of HTN, HLD, DM, recently diagnosed CAD (NSTEMI 04/2015 s/p s/p DES to distal RCA; residual moderate ostial rPDA but vessel overall small; EF 55-65%) who presents for post-hospital follow-up. She had recently been evaluated at the [facility] for chest pain which was deemed possibly related to acid reflux. Outpatient echo had been planned for "enlarged heart" on imaging. In the interim she presented to [facility] for symptoms and ruled in for NSTEMI with subsequent PCI as above on 04/21/15. She was started on ASA/Brilinta. During her hospital stay she was noted to have high blood pressure and ARB was added. Crestor was titrated further.   She comes in for f/u overall doing well. She does notice persistent fatigue that was present even before MI. No CP or SOB. She does wake herself up snoring from time to time. She has never been evaluated for OSA. She is awaiting word back from cardiac rehab in [facility] - interested in participating in their program. BP remains elevated. She did take her medicines already today around 6:30am. Recheck BP in clinic by me was 154/82.   Past Medical History  Diagnosis Date  . Hypertension   . Kidney stones   . Hyperlipidemia   . Type II diabetes mellitus ([place])   . GERD (gastroesophageal reflux disease)   . Arthritis     "legs, arms" (04/20/2015)  . CAD (coronary artery disease)     a. NSTEMI -> LHC 04/21/15: s/p DES to distal RCA; residual moderate ostial rPDA but vessel is overall small; EF 55-65%.  . Obesity     Past Surgical History  Procedure Laterality Date  . Dilation and curettage of uterus  1980's X 1  . Cesarean section  1980  . Cardiac catheterization N/A 04/21/2015    Procedure: Left Heart Cath  and Coronary Angiography;  Surgeon: Wellington Hampshire, MD;  Location: [place] [facility];  Service: Cardiovascular;  Laterality: N/A;  . Cardiac catheterization N/A 04/21/2015    Procedure: Coronary Stent Intervention;  Surgeon: Wellington Hampshire, MD;  Location: [place] [facility];  Service: Cardiovascular;  Laterality: N/A;    Current Outpatient Prescriptions  Medication Sig Dispense Refill  . amLODipine (NORVASC) 10 MG tablet Take 10 mg by mouth daily.    [name] aspirin EC 81 MG tablet Take 81 mg by mouth daily.    . chlorpheniramine (CHLOR-TRIMETON) 4 MG tablet Take 8 mg by mouth 2 (two) times daily as needed for allergies.    . Cholecalciferol (VITAMIN D PO) Take 1 tablet by mouth daily.    . insulin glargine (LANTUS) 100 UNIT/ML injection Inject 60 Units into the skin 2 (two) times daily.    [name] losartan (COZAAR) 50 MG tablet Take 1 tablet (50 mg total) by mouth daily. 30 tablet 6  . metFORMIN (GLUCOPHAGE) 500 MG tablet Take 1,000 mg by mouth 2 (two) times daily with a meal.     . metoprolol (LOPRESSOR) 50 MG tablet Take 50 mg by mouth 2 (two) times daily.    . nitroGLYCERIN (NITROSTAT) 0.4 MG SL tablet Place 1 tablet (0.4 mg total) under the tongue every 5 (five) minutes as needed for chest pain (up to 3 doses). 25 tablet 3  . omeprazole (PRILOSEC) 20 MG  capsule Take 20 mg by mouth daily.    . rosuvastatin (CRESTOR) 20 MG tablet Take 1 tablet (20 mg total) by mouth every evening. 30 tablet 6  . ticagrelor (BRILINTA) 90 MG TABS tablet Take 1 tablet (90 mg total) by mouth 2 (two) times daily. 60 tablet 11   No current facility-administered medications for this visit.    Allergies:   Review of patient's allergies indicates no known allergies.   Social History:  The patient  reports that she has never smoked. She has never used smokeless tobacco. She reports that she does not drink alcohol or use illicit drugs.   Family History:  The patient's family history includes Breast cancer in her sister;  Diabetes in her father, sister, and sister; Heart disease in her mother; Stroke in her father and mother.  ROS:  Please see the history of present illness.    All other systems are reviewed and otherwise negative.   PHYSICAL EXAM:  VS:  BP 166/98 mmHg  Pulse 68  Ht _0  (1.702 m)  Wt 237 lb 6.4 oz (107.684 kg)  BMI 37.17 kg/m2 BMI: Body mass index is 37.17 kg/(m^2). Recheck 154/82 Well nourished, well developed AAF, in no acute distress HEENT: normocephalic, atraumatic Neck: no JVD, carotid bruits or masses Cardiac:  normal S1, S2; RRR; no murmurs, rubs, or gallops Lungs:  clear to auscultation bilaterally, no wheezing, rhonchi or rales Abd: soft, nontender, no hepatomegaly, + BS MS: no deformity or atrophy Ext: no edema, right radial cath site without hematoma or ecchymosis; good pulse. Skin: warm and dry, no rash Neuro:  moves all extremities spontaneously, no focal abnormalities noted, follows commands Psych: euthymic mood, full affect   EKG:  Done today shows NSR 68bpm, 1st degree AVB, minimal voltage criteria for LVH  Recent Labs: 04/20/2015: ALT 32 04/22/2015: BUN 11; Creatinine, Ser 0.78; Hemoglobin 12.2; Platelets 326; Potassium 3.9; Sodium 138  04/21/2015: Cholesterol 116; HDL 33*; LDL Cholesterol 67; Total CHOL/HDL Ratio 3.5; Triglycerides 80; VLDL 16   Estimated Creatinine Clearance: 93.3 mL/min (by C-G formula based on Cr of 0.78).   Wt Readings from Last 3 Encounters:  04/29/15 237 lb 6.4 oz (107.684 kg)  04/22/15 227 lb 8.2 oz (103.2 kg)  07/24/13 231 lb (104.781 kg)     Other studies reviewed: Additional studies/records reviewed today include: summarized above  ASSESSMENT AND PLAN:  1. CAD with recent NSTEMI as above - overall stable post-MI. Continue DAPT, BB, statin. I told her if she does not hear from cardiac rehab in [facility] by Monday to give our office a call so we can facilitate repeat referral. 2. Essential HTN - BP remains elevated. See below  re: OSA. Will titrate Cozaar to 139m daily. Due for repeat lytes/Cr today given recent initiation. Once she is in the cardiac rehab program her BP will be followed regularly there. Check echo to evaluate for hypertensive heart disease. [place] [facility] [place] [facility] [place].  3. HLD - continue statin. Recent alk phos was abnormal - plan to recheck today. Consider f/u lipids/LFTs at f/u visit.  4. Fatigue - given obesity, reported snoring, fatigue, and persistently elevated BP, will pursue sleep study to r/o sleep apnea.  Disposition: F/u with Dr. NAcie Fredricksonin 2-3 months.   Current medicines are reviewed at length with the patient today.  The patient did not have any concerns regarding medicines.  Signed, DMelina CopaPA-C 04/29/2015 8:13 AM  [address] [place] [address] [phone] (office)  [phone] (fax)

## 2015-04-29 ENCOUNTER — Ambulatory Visit (INDEPENDENT_AMBULATORY_CARE_PROVIDER_SITE_OTHER): Payer: 59 | Admitting: Physician Assistant

## 2015-04-29 ENCOUNTER — Telehealth: Payer: Self-pay

## 2015-04-29 ENCOUNTER — Encounter: Payer: Self-pay | Admitting: Physician Assistant

## 2015-04-29 VITALS — BP 166/98 | HR 68 | Ht 67.0 in | Wt 237.4 lb

## 2015-04-29 DIAGNOSIS — E785 Hyperlipidemia, unspecified: Secondary | ICD-10-CM | POA: Diagnosis not present

## 2015-04-29 DIAGNOSIS — I214 Non-ST elevation (NSTEMI) myocardial infarction: Secondary | ICD-10-CM | POA: Diagnosis not present

## 2015-04-29 DIAGNOSIS — R5382 Chronic fatigue, unspecified: Secondary | ICD-10-CM

## 2015-04-29 DIAGNOSIS — I2510 Atherosclerotic heart disease of native coronary artery without angina pectoris: Secondary | ICD-10-CM | POA: Diagnosis not present

## 2015-04-29 DIAGNOSIS — I10 Essential (primary) hypertension: Secondary | ICD-10-CM

## 2015-04-29 DIAGNOSIS — Z9861 Coronary angioplasty status: Secondary | ICD-10-CM

## 2015-04-29 LAB — COMPREHENSIVE METABOLIC PANEL
ALK PHOS: 169 U/L — AB (ref 33–130)
ALT: 45 U/L — AB (ref 6–29)
AST: 44 U/L — AB (ref 10–35)
Albumin: 4 g/dL (ref 3.6–5.1)
BILIRUBIN TOTAL: 0.4 mg/dL (ref 0.2–1.2)
BUN: 17 mg/dL (ref 7–25)
CALCIUM: 10.7 mg/dL — AB (ref 8.6–10.4)
CO2: 24 mmol/L (ref 20–31)
Chloride: 102 mmol/L (ref 98–110)
Creat: 0.96 mg/dL (ref 0.50–0.99)
GLUCOSE: 157 mg/dL — AB (ref 65–99)
Potassium: 4.3 mmol/L (ref 3.5–5.3)
Sodium: 134 mmol/L — ABNORMAL LOW (ref 135–146)
Total Protein: 7.6 g/dL (ref 6.1–8.1)

## 2015-04-29 MED ORDER — ROSUVASTATIN CALCIUM 10 MG PO TABS: 10.0000 mg | ORAL_TABLET | Freq: Every evening | ORAL | Status: DC

## 2015-04-29 MED ORDER — LOSARTAN POTASSIUM 100 MG PO TABS: 100.0000 mg | ORAL_TABLET | Freq: Every day | ORAL | Status: DC

## 2015-04-29 NOTE — Telephone Encounter (Signed)
Sent Crestor 10 mg into patient's pharmacy. Patient will contact her PCP about lab work. Patient requested results to be mailed to her. Will fax copy to PCP.  Notes Recorded by Charlie Pitter, PA-C on 04/29/2015 at 4:23 PM Please call patient. LFTs remain mildly abnormal as in the hospital. Her alk phos was elevated before, and now alk phos/AST/ALT are all mildly abnormal. Alk phos elevation is usually due to an issue in the liver or bone. Calcium level is also slightly elevated. It appears this was elevated several years ago as well. Recommend to decrease Crestor to 60m daily. Avoid extra calcium supplements right now. Ask patient to contact PCP to discuss further evaluation of abnormal LFTs and calcium level. Fax labs to their office. Dayna Dunn PA-C

## 2015-04-29 NOTE — Patient Instructions (Signed)
Medication Instructions:  Your physician has recommended you make the following change in your medication:  INCREASE Losartan to 100mg  daily. An Rx has been sent to your pharmacy    Labwork: Cmet today   Testing/Procedures: Your physician has requested that you have an echocardiogram. Echocardiography is a painless test that uses sound waves to create images of your heart. It provides your doctor with information about the size and shape of your heart and how well your heart's chambers and valves are working. This procedure takes approximately one hour. There are no restrictions for this procedure.  Your physician has recommended that you have a sleep study. This test records several body functions during sleep, including: brain activity, eye movement, oxygen and carbon dioxide blood levels, heart rate and rhythm, breathing rate and rhythm, the flow of air through your mouth and nose, snoring, body muscle movements, and chest and belly movement.    Follow-Up: Your physician recommends that you schedule a follow-up appointment in: 2-3 months with Dr.Nahser    Any Other Special Instructions Will Be Listed Below (If Applicable).     If you need a refill on your cardiac medications before your next appointment, please call your pharmacy.

## 2015-05-04 ENCOUNTER — Telehealth: Payer: Self-pay | Admitting: Cardiovascular Disease

## 2015-05-04 NOTE — Telephone Encounter (Signed)
I called and spoke with the patient. She reports that about a week after she had her NSTMEI/ post PCI (04/21/15), she started to have some SOB with activity. She saw Ronie Spiesayna Dunn, PA 04/29/15. Per Dayna, the patient was noticing some fatigue that was present prior to her MI. She is still complaining of this along with her SOB. She reports SOB will resolve with rest. She denies that symptoms are similar to those present prior to her MI. She is [facility]- her blood sugar has been "out of whack" and her BP has been flucuating. Per the patient, she is [facility] possible [facility] issues/ elevated liver enzymes. She is waiting on a call from them to schedule. I advised the patient I am uncertain as to what her SOB is related to at this time.  I will forward to Dr. Elease HashimotoNahser to review. She is scheduled for an echo 05/24/15. We will call the patient back with further recommendations from Dr. Elease HashimotoNahser. She is agreeable.

## 2015-05-04 NOTE — Telephone Encounter (Signed)
New message    Patient calling    Pt c/o Shortness Of Breath: STAT if SOB developed within the last 24 hours or pt is noticeably SOB on the phone  1. Are you currently SOB (can you hear that pt is SOB on the phone)? No   2. How long have you been experiencing SOB? Couple of day   3. Are you SOB when sitting or when up moving around? Come and going    4. Are you currently experiencing any other symptoms? Tired / [facility]

## 2015-05-04 NOTE — Telephone Encounter (Signed)
Her symptoms could be due to abnormal glucose abnormalities.  She should see her primary MD first

## 2015-05-05 NOTE — Telephone Encounter (Signed)
F/u    Pt need an extension on her leave because she isn't feeling better.

## 2015-05-05 NOTE — Telephone Encounter (Signed)
Pt is aware of Dr. Harvie BridgeNahser's recommendations. Pt is in her endocrinology's office now. Pt states will get the extension of her leave with this doctor.

## 2015-05-21 ENCOUNTER — Other Ambulatory Visit: Payer: Self-pay | Admitting: *Deleted

## 2015-05-21 ENCOUNTER — Telehealth (HOSPITAL_COMMUNITY): Payer: Self-pay | Admitting: *Deleted

## 2015-05-21 MED ORDER — TICAGRELOR 90 MG PO TABS: 90.0000 mg | ORAL_TABLET | Freq: Two times a day (BID) | ORAL | Status: DC

## 2015-05-21 NOTE — Telephone Encounter (Signed)
Pt states that she has been trying to get a refill on her blood thinners that was ordered by Leonard Downingaynna Dunn. She states that the RX doesn't have any refills on it.

## 2015-05-24 ENCOUNTER — Other Ambulatory Visit: Payer: Self-pay

## 2015-05-24 ENCOUNTER — Ambulatory Visit (HOSPITAL_COMMUNITY): Payer: 59 | Attending: Cardiovascular Disease

## 2015-05-24 DIAGNOSIS — I10 Essential (primary) hypertension: Secondary | ICD-10-CM | POA: Insufficient documentation

## 2015-05-24 DIAGNOSIS — I517 Cardiomegaly: Secondary | ICD-10-CM | POA: Diagnosis not present

## 2015-05-24 DIAGNOSIS — E785 Hyperlipidemia, unspecified: Secondary | ICD-10-CM | POA: Diagnosis not present

## 2015-05-24 DIAGNOSIS — E119 Type 2 diabetes mellitus without complications: Secondary | ICD-10-CM | POA: Diagnosis not present

## 2015-05-24 DIAGNOSIS — Z8249 Family history of ischemic heart disease and other diseases of the circulatory system: Secondary | ICD-10-CM | POA: Insufficient documentation

## 2015-05-24 DIAGNOSIS — I214 Non-ST elevation (NSTEMI) myocardial infarction: Secondary | ICD-10-CM | POA: Diagnosis not present

## 2015-06-01 ENCOUNTER — Other Ambulatory Visit: Payer: Self-pay | Admitting: *Deleted

## 2015-06-01 DIAGNOSIS — G4733 Obstructive sleep apnea (adult) (pediatric): Secondary | ICD-10-CM

## 2015-06-07 ENCOUNTER — Telehealth: Payer: Self-pay | Admitting: Cardiovascular Disease

## 2015-06-07 NOTE — Telephone Encounter (Signed)
Left vm for patient to call me back. Medical Certification paper ready for pick up.

## 2015-06-08 ENCOUNTER — Telehealth: Payer: Self-pay | Admitting: Cardiovascular Disease

## 2015-06-08 NOTE — Telephone Encounter (Signed)
Pt says she need the paperwork fax to [phone] Att: Cala Bradford Self please. Thank you very much.

## 2015-06-16 ENCOUNTER — Telehealth: Payer: Self-pay | Admitting: Cardiovascular Disease

## 2015-06-16 NOTE — Telephone Encounter (Signed)
Brilinta cannot be stopped at this time due to new DES 12/16 Patient does not meet criteria for prophylactic antibiotic therapy per AHA guidelines Routing to Dr. Elease Hashimoto for agreement

## 2015-06-16 NOTE — Telephone Encounter (Signed)
Spoke with patient and reviewed Dr. Harvie Bridge advice that she cannot stop Brilinta for tooth extraction at this time and no SBE prophylaxis needed.  Patient states she is only having a piece of 1 tooth removed.  I advised her I would call Dr. Quintella Reichert office.  I spoke with Aurther Loft at Dr. Quintella Reichert office and reviewed Dr. Harvie Bridge advice that patient cannot stop Brilinta at this time and no SBE prophylaxis needed.  I advised that since patient is not having several extractions she should be safe to have single tooth extraction while remaining on Brilinta. Aurther Loft verbalized understanding and thanked me for the call.

## 2015-06-16 NOTE — Telephone Encounter (Signed)
1. What dental office are you calling from? (pt called) Dr. Jearl Klinefelter   2. What is your office phone and fax number? (office) [phone]  3. What type of procedure is the patient having performed? Tooth extraction    4. What date is procedure scheduled? 2/20   5. What is your question (ex. Antibiotics prior to procedure, holding medication-we need to know how long dentist wants pt to hold med)? Does she need to hold her Brilinta and for how long 6.

## 2015-06-16 NOTE — Telephone Encounter (Signed)
Agree with Eligha Bridegroom, RN. He needs Brilinta for a minimum of 6 months, preferably 1 year after his stent placement in Dec. 2016.  He does not need  SBE prophylaxis.

## 2015-07-25 ENCOUNTER — Ambulatory Visit (HOSPITAL_BASED_OUTPATIENT_CLINIC_OR_DEPARTMENT_OTHER): Payer: 59 | Attending: Physician Assistant

## 2015-07-28 ENCOUNTER — Ambulatory Visit: Payer: 59 | Admitting: Cardiovascular Disease

## 2015-08-10 ENCOUNTER — Encounter: Payer: Self-pay | Admitting: Cardiovascular Disease

## 2015-09-24 ENCOUNTER — Encounter: Payer: Self-pay | Admitting: Cardiovascular Disease

## 2015-09-24 ENCOUNTER — Ambulatory Visit (INDEPENDENT_AMBULATORY_CARE_PROVIDER_SITE_OTHER): Payer: 59 | Admitting: Cardiovascular Disease

## 2015-09-24 VITALS — BP 130/78 | HR 76 | Ht 67.0 in | Wt 235.8 lb

## 2015-09-24 DIAGNOSIS — I214 Non-ST elevation (NSTEMI) myocardial infarction: Secondary | ICD-10-CM

## 2015-09-24 DIAGNOSIS — I10 Essential (primary) hypertension: Secondary | ICD-10-CM

## 2015-09-24 NOTE — Patient Instructions (Signed)

## 2015-09-24 NOTE — Progress Notes (Signed)
Cardiology Office Note Date:  09/24/2015  Patient ID:  Danielle, Fisher 02/21/1956, MRN [id number] PCP:  Pcp Not In System  Cardiologist:  Nahser  Problem list 1. Coronary artery disease-status post stenting of the distal RCA 2. Hyperlipidemia 3. Essential hypertension 4. Diabetes mellitus  Chief Complaint: f/u NSTEMI  History of Present Illness: Danielle Fisher is a 60 y.o. female with history of HTN, HLD, DM, recently diagnosed CAD (NSTEMI 04/2015 s/p s/p DES to distal RCA; residual moderate ostial rPDA but vessel overall small; EF 55-65%) who presents for post-hospital follow-up. She had recently been evaluated at the [facility] for chest pain which was deemed possibly related to acid reflux. Outpatient echo had been planned for "enlarged heart" on imaging. In the interim she presented to [facility] for symptoms and ruled in for NSTEMI with subsequent PCI as above on 04/21/15. She was started on ASA/Brilinta. During her hospital stay she was noted to have high blood pressure and ARB was added. Crestor was titrated further.   She comes in for f/u overall doing well. She does notice persistent fatigue that was present even before MI. No CP or SOB. She does wake herself up snoring from time to time. She has never been evaluated for OSA. She is awaiting word back from cardiac rehab in [facility] - interested in participating in their program. BP remains elevated. She did take her medicines already today around 6:30am. Recheck BP in clinic by me was 154/82.  Sep 24, 2015: Primary MD is at the [place] is a 28 yo who I met in Dec, 2016 with UAP. Cath revealed a tight right coronary stenosis. 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  She's done well since that time. She's not had any further episodes of chest pain or shortness of breath.  She has occasional palpitations that last for perhaps a few seconds. She saw her primary medical doctor at the [place] last week. She was noted have some  mild locations of her liver enzymes. She had a GI workup.  Past Medical History  Diagnosis Date  . Hypertension   . Kidney stones   . Hyperlipidemia   . Type II diabetes mellitus ([place])   . GERD (gastroesophageal reflux disease)   . Arthritis     "legs, arms" (04/20/2015)  . CAD (coronary artery disease)     a. NSTEMI -> LHC 04/21/15: s/p DES to distal RCA; residual moderate ostial rPDA but vessel is overall small; EF 55-65%.  . Obesity     Past Surgical History  Procedure Laterality Date  . Dilation and curettage of uterus  1980's X 1  . Cesarean section  1980  . Cardiac catheterization N/A 04/21/2015    Procedure: Left Heart Cath and Coronary Angiography;  Surgeon: Wellington Hampshire, MD;  Location: [facility];  Service: Cardiovascular;  Laterality: N/A;  . Cardiac catheterization N/A 04/21/2015    Procedure: Coronary Stent Intervention;  Surgeon: Wellington Hampshire, MD;  Location: [facility];  Service: Cardiovascular;  Laterality: N/A;    Current Outpatient Prescriptions  Medication Sig Dispense Refill  . amLODipine (NORVASC) 10 MG tablet Take 10 mg by mouth daily.    [name] aspirin EC 81 MG tablet Take 81 mg by mouth daily.    . Cholecalciferol (VITAMIN D PO) Take 1 tablet by mouth daily.    . insulin glargine (LANTUS) 100 UNIT/ML injection Inject 60 Units into the skin 2 (two) times daily.    [name]  losartan (COZAAR) 100 MG tablet Take 1 tablet (100 mg total) by mouth daily. 30 tablet 5  . metoprolol (LOPRESSOR) 50 MG tablet Take 50 mg by mouth 2 (two) times daily.    . nitroGLYCERIN (NITROSTAT) 0.4 MG SL tablet Place 1 tablet (0.4 mg total) under the tongue every 5 (five) minutes as needed for chest pain (up to 3 doses). 25 tablet 3  . rosuvastatin (CRESTOR) 10 MG tablet Take 1 tablet (10 mg total) by mouth every evening. 30 tablet 11  . ticagrelor (BRILINTA) 90 MG TABS tablet Take 1 tablet (90 mg total) by mouth 2 (two) times daily. 60 tablet 10   No current  facility-administered medications for this visit.    Allergies:   Review of patient's allergies indicates no known allergies.   Social History:  The patient  reports that she has never smoked. She has never used smokeless tobacco. She reports that she does not drink alcohol or use illicit drugs.   Family History:  The patient's family history includes Breast cancer in her sister; Diabetes in her father, sister, and sister; Heart disease in her mother; Stroke in her father and mother.  ROS:  Please see the history of present illness.    All other systems are reviewed and otherwise negative.   PHYSICAL EXAM:  VS:  BP 130/78 mmHg  Pulse 76  Ht 5' 7"  (1.702 m)  Wt 235 lb 12.8 oz (106.958 kg)  BMI 36.92 kg/m2  SpO2 96% BMI: Body mass index is 36.92 kg/(m^2). Recheck 154/82 Well nourished, well developed AAF, in no acute distress HEENT: normocephalic, atraumatic Neck: no JVD, carotid bruits or masses Cardiac:  normal S1, S2; RRR; no murmurs, rubs, or gallops Lungs:  clear to auscultation bilaterally, no wheezing, rhonchi or rales Abd: soft, nontender, no hepatomegaly, + BS MS: no deformity or atrophy Ext: no edema, right radial cath site without hematoma or ecchymosis; good pulse. Skin: warm and dry, no rash Neuro:  moves all extremities spontaneously, no focal abnormalities noted, follows commands Psych: euthymic mood, full affect   EKG:  Done today shows NSR 68bpm, 1st degree AVB, minimal voltage criteria for LVH  Recent Labs: 04/22/2015: Hemoglobin 12.2; Platelets 326 04/29/2015: ALT 45*; BUN 17; Creat 0.96; Potassium 4.3; Sodium 134*  04/21/2015: Cholesterol 116; HDL 33*; LDL Cholesterol 67; Total CHOL/HDL Ratio 3.5; Triglycerides 80; VLDL 16   CrCl cannot be calculated (Patient has no serum creatinine result on file.).   Wt Readings from Last 3 Encounters:  09/24/15 235 lb 12.8 oz (106.958 kg)  04/29/15 237 lb 6.4 oz (107.684 kg)  04/22/15 227 lb 8.2 oz (103.2 kg)      Other studies reviewed: Additional studies/records reviewed today include: summarized above  ASSESSMENT AND PLAN:  1. CAD with recent NSTEMI as above - overall stable post-MI. Continue DAPT, BB, statin.                                         2. Essential HTN - BP is well controlled.    3. HLD - continue statin.  She [facility]. She'll send [place] a copy of those.  4. Fatigue - continue exercise, further eval per her primary MD    Mertie Moores, MD  09/24/2015 8:48 AM    Danielle Fisher [address]  [place], [place]  [address] Pager [place] Phone: [phone]; Fax: [phone]

## 2016-01-14 ENCOUNTER — Telehealth: Payer: Self-pay | Admitting: *Deleted

## 2016-01-14 NOTE — Telephone Encounter (Signed)
Phone number on file is invalid. Unable to complete Pre-Visit Call.

## 2016-01-17 ENCOUNTER — Encounter: Payer: Self-pay | Admitting: Emergency Medicine

## 2016-01-17 ENCOUNTER — Telehealth: Payer: Self-pay

## 2016-01-17 ENCOUNTER — Ambulatory Visit: Payer: 59 | Admitting: Family Medicine

## 2016-01-17 NOTE — Telephone Encounter (Signed)
No charge. 

## 2016-01-17 NOTE — Telephone Encounter (Signed)
Patient LVM 01/16/16 at 10:42pm cancelling her new patient appointment for today[facility] Charge or no charge

## 2016-01-28 ENCOUNTER — Other Ambulatory Visit: Payer: Self-pay | Admitting: Physician Assistant

## 2016-02-21 ENCOUNTER — Encounter: Payer: Self-pay | Admitting: Family Medicine

## 2016-02-21 ENCOUNTER — Ambulatory Visit (INDEPENDENT_AMBULATORY_CARE_PROVIDER_SITE_OTHER): Payer: 59 | Admitting: Family Medicine

## 2016-02-21 VITALS — BP 134/78 | HR 75 | Temp 97.8°F | Resp 17 | Ht 67.0 in | Wt 228.0 lb

## 2016-02-21 DIAGNOSIS — Z794 Long term (current) use of insulin: Secondary | ICD-10-CM

## 2016-02-21 DIAGNOSIS — K760 Fatty (change of) liver, not elsewhere classified: Secondary | ICD-10-CM | POA: Diagnosis not present

## 2016-02-21 DIAGNOSIS — E119 Type 2 diabetes mellitus without complications: Secondary | ICD-10-CM

## 2016-02-21 DIAGNOSIS — E785 Hyperlipidemia, unspecified: Secondary | ICD-10-CM

## 2016-02-21 MED ORDER — ROSUVASTATIN CALCIUM 20 MG PO TABS: 30.0000 mg | ORAL_TABLET | Freq: Every day | ORAL | 11 refills | Status: DC

## 2016-02-21 NOTE — Patient Instructions (Addendum)
We will check your A1c today to make sure it is still under good control.  If your A1c is indeed under 6% we should decrease your insulin dose I will also check on your liver function, electrolytes and kidney function today  It was very nice to meet you!

## 2016-02-21 NOTE — Progress Notes (Signed)
Pre visit review using our clinic review tool, if applicable. No additional management support is needed unless otherwise documented below in the visit note. 

## 2016-02-21 NOTE — Progress Notes (Addendum)
[facility] [address], [address] [place], [place] [address] [phone] [phone]  Date:  02/21/2016   Name:  Danielle RichmondKathleen Fisher   DOB:  05-10-1955   MRN:  [id number]  PCP:  Abbe AmsterdamOPLAND,Mariette Cowley, MD    Chief Complaint: Establish Care   History of Present Illness:  Danielle RichmondKathleen Fisher is a 60 y.o. very pleasant female patient who presents with the following:  [facility] but would now like to see us for her PCP.  She has been a private cardiology pt.  She had an MI in 2016, treated with stenting.  She is doing well overall.  She has used her nitro once about 3 weeks ago.  No active CP She does have DM on insulin- she has had this for about 15 years.  [facility] She is on novolog 46 BID She had an A1c last week- it was 5.7%. However she is not sure if she believes this as it is much better than usual.  She would like to repeat this today. She has noted that her glucose meter readings have also looked good lately.  She has not changed her lifestyle or medications to explain this improvement however  [facility]- she did have a mammo and bx last year. bx was benign, she plans a mammogram before the end of the year She did a cologuard test last year- it was negative   She is retired from the [employer]- she served for about 25 years and spent several years stationed overseas. 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 CAD S/P percutaneous coronary angioplasty 04/22/2015  . Obesity 04/22/2015  . NSTEMI (non-ST elevated myocardial infarction) (HCC) 04/21/2015  . HTN (hypertension) 04/21/2015  . HLD (hyperlipidemia) 04/21/2015  . Diabetes mellitus, insulin dependent (IDDM), uncontrolled (HCC) 04/21/2015   . Thyromegaly 12/21/2012  . Cough 12/19/2012    Past Medical History:  Diagnosis Date  . Arthritis    "legs, arms" (04/20/2015)  . CAD (coronary artery disease)    a. NSTEMI -> LHC 04/21/15: s/p DES to distal RCA; residual moderate ostial rPDA but vessel is overall small; EF 55-65%.  [name]. GERD (gastroesophageal reflux disease)   . Hyperlipidemia   . Hypertension   . Kidney stones   . Obesity   . Type II diabetes mellitus (HCC)     Past Surgical History:  Procedure Laterality Date  . CARDIAC CATHETERIZATION N/A 04/21/2015   Procedure: Left Heart Cath and Coronary Angiography;  Surgeon: Iran OuchMuhammad A Arida, MD;  [facility]: [facility];  Service: Cardiovascular;  Laterality: N/A;  . CARDIAC CATHETERIZATION N/A 04/21/2015   Procedure: Coronary Stent Intervention;  Surgeon: Iran OuchMuhammad A Arida, MD;  Location: [facility];  Service: Cardiovascular;  Laterality: N/A;  . CESAREAN SECTION  1980  . DILATION AND CURETTAGE OF UTERUS  1980's X 1    Social History  Substance Use Topics  . Smoking status: Never Smoker  . Smokeless tobacco: Never Used  . Alcohol use No    Family History  Problem Relation Age of Onset  . Heart disease Mother   . Stroke Mother   . Breast cancer Sister   .  Diabetes Sister   . Stroke Father   . Diabetes Father   . Diabetes Sister     No Known Allergies  Medication list has been reviewed and updated.  Current Outpatient Prescriptions on File Prior to Visit  Medication Sig Dispense Refill  . amLODipine (NORVASC) 10 MG tablet Take 10 mg by mouth daily.    [name] aspirin EC 81 MG tablet Take 81 mg by mouth daily.    . Cholecalciferol (VITAMIN D PO) Take 1 tablet by mouth daily.    [name] losartan (COZAAR) 100 MG tablet Take 1 tablet (100 mg total) by mouth daily. 30 tablet 5  . metoprolol (LOPRESSOR) 50 MG tablet Take 50 mg by mouth 2 (two) times daily.    . nitroGLYCERIN (NITROSTAT) 0.4 MG SL tablet Place 1 tablet (0.4 mg total) under the tongue every 5  (five) minutes as needed for chest pain (up to 3 doses). 25 tablet 3  . rosuvastatin (CRESTOR) 10 MG tablet Take 1 tablet (10 mg total) by mouth every evening. 30 tablet 11  . rosuvastatin (CRESTOR) 20 MG tablet TAKE ONE TABLET BY MOUTH ONCE DAILY IN THE EVENING 30 tablet 9  . ticagrelor (BRILINTA) 90 MG TABS tablet Take 1 tablet (90 mg total) by mouth 2 (two) times daily. 60 tablet 10  . insulin glargine (LANTUS) 100 UNIT/ML injection Inject 60 Units into the skin 2 (two) times daily.     No current facility-administered medications on file prior to visit.     Review of Systems:  As per HPI- otherwise negative. No fever, chills, current CP, SOB, nausea or vomiting  Physical Examination: Vitals:   02/21/16 1628  BP: 134/78  Pulse: 75  Resp: 17  Temp: 97.8 F (36.6 C)   Vitals:   02/21/16 1628  Weight: 228 lb (103.4 kg)  Height: 5\' 7"  (1.702 m)   Body mass index is 35.71 kg/m. Ideal Body Weight: Weight in (lb) to have BMI = 25: 159.3  GEN: WDWN, NAD, Non-toxic, A & O x 3, obese, otherwise looks well HEENT: Atraumatic, Normocephalic. Neck supple. No masses, No LAD. Ears and Nose: No external deformity. CV: RRR, No M/G/R. No JVD. No thrill. No extra heart sounds. PULM: CTA B, no wheezes, crackles, rhonchi. No retractions. No resp. distress. No accessory muscle use. EXTR: No c/c/e NEURO Normal gait.  PSYCH: Normally interactive. Conversant. Not depressed or anxious appearing.  Calm demeanor.    Assessment and Plan: Controlled type 2 diabetes mellitus without complication, with long-term current use of insulin (HCC) - Plan: Hemoglobin A1C  Dyslipidemia - Plan: rosuvastatin (CRESTOR) 20 MG tablet  Fatty liver - Plan: Comprehensive metabolic panel  Here today to establish care and confirm recent dramatic improvement in her A1c.  We will check this for her today and plan to adjust her insulin if indeed under 6%. She is currently taking crestor 20 + 10 to equal 30 mg; however  this is 2 co-pays.  Will change to 20 mg 1.5 tablets daily She has been told she has had a fatty liver on [place] by the [place]- would like to check her LFTs today  Will plan further follow- up pending labs.  Signed Abbe Amsterdam, MD  Called pt on 10/21 to go over her labs as below  Results for orders placed or performed in visit on 02/21/16  Comprehensive metabolic panel  Result Value Ref Range   Sodium 138 135 - 145 mEq/L   Potassium 4.2 3.5 - 5.1 mEq/L  Chloride 105 96 - 112 mEq/L   CO2 25 19 - 32 mEq/L   Glucose, Bld 118 (H) 70 - 99 mg/dL   BUN 23 6 - 23 mg/dL   Creatinine, Ser 4.09 0.40 - 1.20 mg/dL   Total Bilirubin 0.4 0.2 - 1.2 mg/dL   Alkaline Phosphatase 160 (H) 39 - 117 U/L   AST 18 0 - 37 U/L   ALT 19 0 - 35 U/L   Total Protein 8.4 (H) 6.0 - 8.3 g/dL   Albumin 4.2 3.5 - 5.2 g/dL   Calcium 81.1 (H) 8.4 - 10.5 mg/dL   GFR 91.47 >82.95 mL/min  Hemoglobin A1C  Result Value Ref Range   Hgb A1c MFr Bld 7.5 (H) 4.6 - 6.5 %   She may have hyperparathyroidism.  She will come in for a PTH panel as a lab visit only in the next week or so. Her A1c is at her usual baseline- continue current meds

## 2016-02-22 LAB — COMPREHENSIVE METABOLIC PANEL
ALBUMIN: 4.2 g/dL (ref 3.5–5.2)
ALT: 19 U/L (ref 0–35)
AST: 18 U/L (ref 0–37)
Alkaline Phosphatase: 160 U/L — ABNORMAL HIGH (ref 39–117)
BUN: 23 mg/dL (ref 6–23)
CALCIUM: 11.2 mg/dL — AB (ref 8.4–10.5)
CHLORIDE: 105 meq/L (ref 96–112)
CO2: 25 mEq/L (ref 19–32)
Creatinine, Ser: 1.16 mg/dL (ref 0.40–1.20)
GFR: 60.84 mL/min (ref >60.00)
Glucose, Bld: 118 mg/dL — ABNORMAL HIGH (ref 70–99)
POTASSIUM: 4.2 meq/L (ref 3.5–5.1)
Sodium: 138 mEq/L (ref 135–145)
Total Bilirubin: 0.4 mg/dL (ref 0.2–1.2)
Total Protein: 8.4 g/dL — ABNORMAL HIGH (ref 6.0–8.3)

## 2016-02-22 LAB — HEMOGLOBIN A1C: HEMOGLOBIN A1C: 7.5 % — AB (ref 4.6–6.5)

## 2016-02-26 ENCOUNTER — Encounter: Payer: Self-pay | Admitting: Family Medicine

## 2016-02-26 NOTE — Addendum Note (Signed)
Addended by: Abbe AmsterdamOPLAND, JESSICA C on: 02/26/2016 01:14 PM   Modules accepted: Orders

## 2016-03-07 ENCOUNTER — Other Ambulatory Visit (INDEPENDENT_AMBULATORY_CARE_PROVIDER_SITE_OTHER): Payer: 59

## 2016-03-08 LAB — PTH, INTACT AND CALCIUM
CALCIUM: 11 mg/dL — AB (ref 8.6–10.4)
PTH: 70 pg/mL — ABNORMAL HIGH (ref 14–64)

## 2016-03-09 ENCOUNTER — Encounter: Payer: Self-pay | Admitting: Family Medicine

## 2016-03-09 ENCOUNTER — Telehealth: Payer: Self-pay | Admitting: Family Medicine

## 2016-03-09 DIAGNOSIS — E213 Hyperparathyroidism, unspecified: Secondary | ICD-10-CM

## 2016-03-09 NOTE — Telephone Encounter (Signed)
Called and left detailed message- it appears that her parathyroids are overactive.  Will refer to endocrinology to help confirm and guide treatment which will likely consist of a parathyroidectomy.  Let me know if any questions about this

## 2016-03-20 ENCOUNTER — Telehealth: Payer: Self-pay | Admitting: Emergency Medicine

## 2016-03-20 NOTE — Telephone Encounter (Signed)
Pt called with questions regarding voicemail left by provider on 11/2. Pt states she has been going [facility] and having blood work done for years and just wonders why no one has ever caught this before. Pt states [facility] and wonders if she should mention the results to them.   Pt would like a call back to discuss.

## 2016-03-20 NOTE — Telephone Encounter (Signed)
Called her back and LMOM- I noticed that her calcium was normal as of about one year ago.  This is usually the first clue to hyperparathyroidism; I would presume that this is a fairly new problem

## 2016-04-04 ENCOUNTER — Encounter: Payer: Self-pay | Admitting: Cardiovascular Disease

## 2016-04-12 ENCOUNTER — Encounter (INDEPENDENT_AMBULATORY_CARE_PROVIDER_SITE_OTHER): Payer: Self-pay

## 2016-04-12 ENCOUNTER — Encounter: Payer: Self-pay | Admitting: Cardiovascular Disease

## 2016-04-12 ENCOUNTER — Ambulatory Visit (INDEPENDENT_AMBULATORY_CARE_PROVIDER_SITE_OTHER): Payer: 59 | Admitting: Cardiovascular Disease

## 2016-04-12 VITALS — BP 144/78 | HR 70 | Ht 67.0 in | Wt 230.1 lb

## 2016-04-12 DIAGNOSIS — I2510 Atherosclerotic heart disease of native coronary artery without angina pectoris: Secondary | ICD-10-CM

## 2016-04-12 DIAGNOSIS — E782 Mixed hyperlipidemia: Secondary | ICD-10-CM

## 2016-04-12 DIAGNOSIS — I10 Essential (primary) hypertension: Secondary | ICD-10-CM

## 2016-04-12 LAB — COMPREHENSIVE METABOLIC PANEL
ALBUMIN: 3.6 g/dL (ref 3.6–5.1)
ALT: 17 U/L (ref 6–29)
AST: 18 U/L (ref 10–35)
Alkaline Phosphatase: 169 U/L — ABNORMAL HIGH (ref 33–130)
BUN: 17 mg/dL (ref 7–25)
CALCIUM: 10 mg/dL (ref 8.6–10.4)
CHLORIDE: 102 mmol/L (ref 98–110)
CO2: 22 mmol/L (ref 20–31)
Creat: 0.79 mg/dL (ref 0.50–0.99)
Glucose, Bld: 351 mg/dL — ABNORMAL HIGH (ref 65–99)
POTASSIUM: 4.6 mmol/L (ref 3.5–5.3)
Sodium: 134 mmol/L — ABNORMAL LOW (ref 135–146)
TOTAL PROTEIN: 6.7 g/dL (ref 6.1–8.1)
Total Bilirubin: 0.4 mg/dL (ref 0.2–1.2)

## 2016-04-12 LAB — LIPID PANEL
Cholesterol: 117 mg/dL (ref <200)
HDL: 38 mg/dL — AB (ref >50)
LDL CALC: 65 mg/dL (ref <100)
TRIGLYCERIDES: 69 mg/dL (ref <150)
Total CHOL/HDL Ratio: 3.1 Ratio (ref <5.0)
VLDL: 14 mg/dL (ref <30)

## 2016-04-12 MED ORDER — CLOPIDOGREL BISULFATE 75 MG PO TABS: 75.0000 mg | ORAL_TABLET | Freq: Every day | ORAL | 11 refills | Status: DC

## 2016-04-12 MED ORDER — CARVEDILOL 25 MG PO TABS: 25.0000 mg | ORAL_TABLET | Freq: Two times a day (BID) | ORAL | 11 refills | Status: DC

## 2016-04-12 NOTE — Progress Notes (Signed)
Cardiology Office Note Date:  04/12/2016  Patient ID:  Danielle Fisher, Danielle Fisher 1955/09/05, MRN [id number] PCP:  Lamar Blinks, MD  Cardiologist:  Fayetta Sorenson  Problem list 1. Coronary artery disease-status post stenting of the distal RCA 2. Hyperlipidemia 3. Essential hypertension 4. Diabetes mellitus  Chief Complaint: f/u NSTEMI    Danielle Fisher is a 60 y.o. female with history of HTN, HLD, DM, recently diagnosed CAD (NSTEMI 04/2015 s/p s/p DES to distal RCA; residual moderate ostial rPDA but vessel overall small; EF 55-65%) who presents for post-hospital follow-up. She had recently been evaluated at the [facility] for chest pain which was deemed possibly related to acid reflux. Outpatient echo had been planned for "enlarged heart" on imaging. In the interim she presented to [facility] for symptoms and ruled in for NSTEMI with subsequent PCI as above on 04/21/15. She was started on ASA/Brilinta. During her hospital stay she was noted to have high blood pressure and ARB was added. Crestor was titrated further.   She comes in for f/u overall doing well. She does notice persistent fatigue that was present even before MI. No CP or SOB. She does wake herself up snoring from time to time. She has never been evaluated for OSA. She is awaiting word back from cardiac rehab in [facility] - interested in participating in their program. BP remains elevated. She did take her medicines already today around 6:30am. Recheck BP in clinic by me was 154/82.  Sep 24, 2015: Primary MD is at the [place] is a 53 yo who I met in Dec, 2016 with UAP. Cath revealed a tight right coronary stenosis. 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  She's done well since that time. She's not had any further episodes of chest pain or shortness of breath.  She has occasional palpitations that last for perhaps a few seconds. She saw her primary medical doctor at the [place] last week. She was noted have some mild locations of her  liver enzymes. She had a GI workup.  Dec. 6, 2017:  Doing well.  No angian  Checks BP at home.   BP fluctuates.   Is generally better than last year.   Is watching her salt .    Past Medical History:  Diagnosis Date  . Arthritis    "legs, arms" (04/20/2015)  . CAD (coronary artery disease)    a. NSTEMI -> LHC 04/21/15: s/p DES to distal RCA; residual moderate ostial rPDA but vessel is overall small; EF 55-65%.  [name] GERD (gastroesophageal reflux disease)   . Hyperlipidemia   . Hypertension   . Kidney stones   . Obesity   . Type II diabetes mellitus ([place])     Past Surgical History:  Procedure Laterality Date  . CARDIAC CATHETERIZATION N/A 04/21/2015   Procedure: Left Heart Cath and Coronary Angiography;  Surgeon: Wellington Hampshire, MD;  Location: [place] [facility];  Service: Cardiovascular;  Laterality: N/A;  . CARDIAC CATHETERIZATION N/A 04/21/2015   Procedure: Coronary Stent Intervention;  Surgeon: Wellington Hampshire, MD;  Location: [place] [facility];  Service: Cardiovascular;  Laterality: N/A;  . CESAREAN SECTION  1980  . DILATION AND CURETTAGE OF UTERUS  1980's X 1    Current Outpatient Prescriptions  Medication Sig Dispense Refill  . amLODipine (NORVASC) 10 MG tablet Take 10 mg by mouth daily.    [name] aspirin EC 81 MG tablet Take 81 mg by mouth daily.    . Cholecalciferol (VITAMIN D PO) Take  1 tablet by mouth daily.    . insulin aspart (NOVOLOG) 100 UNIT/ML injection Inject 46 Units into the skin 2 (two) times daily before a meal.    . losartan (COZAAR) 100 MG tablet Take 1 tablet (100 mg total) by mouth daily. 30 tablet 5  . metoprolol (LOPRESSOR) 50 MG tablet Take 50 mg by mouth 2 (two) times daily.    . nitroGLYCERIN (NITROSTAT) 0.4 MG SL tablet Place 1 tablet (0.4 mg total) under the tongue every 5 (five) minutes as needed for chest pain (up to 3 doses). 25 tablet 3  . rosuvastatin (CRESTOR) 20 MG tablet Take 1.5 tablets (30 mg total) by mouth daily. 45 tablet 11  .  ticagrelor (BRILINTA) 90 MG TABS tablet Take 1 tablet (90 mg total) by mouth 2 (two) times daily. 60 tablet 10   No current facility-administered medications for this visit.     Allergies:   Patient has no known allergies.   Social History:  The patient  reports that she has never smoked. She has never used smokeless tobacco. She reports that she does not drink alcohol or use drugs.   Family History:  The patient's family history includes Breast cancer in her sister; Diabetes in her father, sister, and sister; Heart disease in her mother; Stroke in her father and mother.  ROS:  Please see the history of present illness.    All other systems are reviewed and otherwise negative.   PHYSICAL EXAM:  VS:  BP (!) 144/78   Pulse 70   Ht _0  (1.702 m)   Wt 230 lb 1.9 oz (104.4 kg)   BMI 36.04 kg/m  BMI: Body mass index is 36.04 kg/m. Recheck 154/82 Well nourished, well developed AAF, in no acute distress  HEENT: normocephalic, atraumatic  Neck: no JVD, carotid bruits or masses Cardiac:  normal S1, S2; RRR; no murmurs, rubs, or gallops Lungs:  clear to auscultation bilaterally, no wheezing, rhonchi or rales  Abd: soft, nontender, no hepatomegaly, + BS MS: no deformity or atrophy Ext: no edema, right radial cath site without hematoma or ecchymosis; good pulse. Skin: warm and dry, no rash Neuro:  moves all extremities spontaneously, no focal abnormalities noted, follows commands Psych: euthymic mood, full affect   EKG:   Dec. 6, 2017:   NSR at 70 with 1st degree AV.   Previous Ant. MI   Recent Labs: 04/22/2015: Hemoglobin 12.2; Platelets 326 02/21/2016: ALT 19; BUN 23; Creatinine, Ser 1.16; Potassium 4.2; Sodium 138  04/21/2015: Cholesterol 116; HDL 33; LDL Cholesterol 67; Total CHOL/HDL Ratio 3.5; Triglycerides 80; VLDL 16   CrCl cannot be calculated (Patient's most recent lab result is older than the maximum 21 days allowed.).   Wt Readings from Last 3 Encounters:  04/12/16 230  lb 1.9 oz (104.4 kg)  02/21/16 228 lb (103.4 kg)  09/24/15 235 lb 12.8 oz (107 kg)     Other studies reviewed: Additional studies/records reviewed today include: summarized above  ASSESSMENT AND PLAN:  1. CAD with recent NSTEMI as above - overall stable post-MI.  We'll stop the Brilinta at this time since at its been one year since her stent. We'll start Plavix 75 mg a day.                                         2. Essential HTN - BP is better but is still  slightly elevated. We'll stop the metoprolol and start her on carvedilol 25 mg twice a day.   3. HLD - continue statin.  We'll check fasting labs today.  4. Fatigue - continue exercise, further eval per her primary MD    Mertie Moores, MD  04/12/2016 8:54 AM    [address],  [place], [place]  [address] Pager [phone] Phone: [phone]; Fax: [phone]

## 2016-04-12 NOTE — Patient Instructions (Addendum)
Medication Instructions:  STOP Brilinta START Plavix (Clopidogrel) 75 mg once daily STOP Metoprolol START Carvedilol (Coreg) 25 mg twice daily   Labwork: TODAY - complete metabolic panel, cholesterol   Testing/Procedures: None Ordered   Follow-Up: Your physician recommends that you return for a follow-up appointment in: 1 month for BP check/nurse visit - Marcelino DusterMichelle will call you back to schedule  Your physician wants you to follow-up in: 6 months with Dr. Elease HashimotoNahser.  You will receive a reminder letter in the mail two months in advance. If you don't receive a letter, please call our office to schedule the follow-up appointment.   If you need a refill on your cardiac medications before your next appointment, please call your pharmacy.   Thank you for choosing [facility]! Eligha BridegroomMichelle Ruby Dilone, RN [phone]

## 2016-04-24 ENCOUNTER — Telehealth: Payer: Self-pay | Admitting: Nurse Practitioner

## 2016-04-24 DIAGNOSIS — I10 Essential (primary) hypertension: Secondary | ICD-10-CM

## 2016-04-24 NOTE — Telephone Encounter (Signed)
Called patient to schedule her 1 mo BP recheck per Dr. Harvie BridgeNahser's instructions from Dec. 6 office visit. Patient states she did not switch from metoprolol to carvedilol until last week due to having a large amount of the metoprolol she had recently picked up from the pharmacy.  She states her work schedule will not allow her to return to our office in early January but [place] [facility] [place].  I asked her to call back to report her BP after that visit.  She verbalized understanding and agreement with plan.

## 2016-05-04 NOTE — Telephone Encounter (Signed)
Spoke with patient regarding having her BP checked.  She advised at last office visit that she could not return to the office for a BP check due to her work schedule.  Today she had her BP checked by a nurse in her office and reports BP of 178/106 with an electric BP cuff.  She does not know heart rate. She states she has a home BP monitor but has not been checking it regularly.  I advised her to monitor at home for the next week and I will call her when I return to the office next week to see how she is doing. She denies complaints of headache, visual changes, weakness or any other concerns. She states she was not aware that her BP was high. She verified adherence to prescribed medications.  I advised her to seek emergency medical treatment if she experiences signs of a stroke. Patient verbalized understanding and agreement with plan of care.

## 2016-05-04 NOTE — Telephone Encounter (Signed)
F/u Message ° °Pt returning RN call. Please call back to discuss  °

## 2016-05-10 NOTE — Telephone Encounter (Signed)
Lets add HCTZ 25 mg a day and Kdur 20 meq a day . Check BMP in 2-3 weeks.

## 2016-05-11 MED ORDER — HYDROCHLOROTHIAZIDE 25 MG PO TABS: 25.0000 mg | ORAL_TABLET | Freq: Every day | ORAL | 11 refills | Status: DC

## 2016-05-11 MED ORDER — POTASSIUM CHLORIDE CRYS ER 20 MEQ PO TBCR: 20.0000 meq | EXTENDED_RELEASE_TABLET | Freq: Every day | ORAL | 11 refills | Status: DC

## 2016-05-11 NOTE — Telephone Encounter (Signed)
Spoke with patient who states she has been monitoring BP at home and reports readings of 140/82 mmHg and   152/79 mm Hg.  I reviewed Dr. Harvie BridgeNahser's advice with her and she verbalized understanding and agreement with plan. She is scheduled for lab appointment on 1/24. I advised her to call back with questions or concerns prior to follow-up. She thanked me for the call.

## 2016-05-31 ENCOUNTER — Other Ambulatory Visit: Payer: 59 | Admitting: *Deleted

## 2016-05-31 DIAGNOSIS — I10 Essential (primary) hypertension: Secondary | ICD-10-CM

## 2016-06-01 LAB — BASIC METABOLIC PANEL
BUN/Creatinine Ratio: 26 (ref 12–28)
BUN: 24 mg/dL (ref 8–27)
CHLORIDE: 98 mmol/L (ref 96–106)
CO2: 23 mmol/L (ref 18–29)
Calcium: 10.6 mg/dL — ABNORMAL HIGH (ref 8.7–10.3)
Creatinine, Ser: 0.93 mg/dL (ref 0.57–1.00)
GFR calc non Af Amer: 66 mL/min/{1.73_m2} (ref >59)
GFR, EST AFRICAN AMERICAN: 76 mL/min/{1.73_m2} (ref >59)
Glucose: 215 mg/dL — ABNORMAL HIGH (ref 65–99)
POTASSIUM: 4.8 mmol/L (ref 3.5–5.2)
SODIUM: 137 mmol/L (ref 134–144)

## 2016-07-14 ENCOUNTER — Telehealth: Payer: Self-pay | Admitting: Family Medicine

## 2016-07-14 NOTE — Telephone Encounter (Signed)
Caller name: Relationship to patient:Self Can be reached: [phone]  Pharmacy:  Reason for call: Request a referral to see and Endocrinologist

## 2016-07-17 NOTE — Telephone Encounter (Signed)
Referral placed by Dr. Patsy Lageropland.

## 2016-08-07 ENCOUNTER — Ambulatory Visit: Payer: 59 | Admitting: Endocrinology

## 2016-08-30 ENCOUNTER — Ambulatory Visit: Payer: 59 | Admitting: Endocrinology

## 2016-10-16 ENCOUNTER — Encounter: Payer: Self-pay | Admitting: Physician Assistant

## 2016-11-01 ENCOUNTER — Encounter: Payer: Self-pay | Admitting: Family Medicine

## 2016-11-01 NOTE — Telephone Encounter (Signed)
error:[id number] ° °

## 2016-11-01 NOTE — Telephone Encounter (Addendum)
Patient states she thought she was seeing Dr. Katrinka BlazingSmith endo for her blood sugar and not for her calcium and would like to discuss, please advise best # [phone]

## 2016-11-01 NOTE — Telephone Encounter (Signed)
[facility]Smith will help with both of these issues.  I read note and it looks like she is in good hands.   Please let me know if any other questions

## 2016-11-02 ENCOUNTER — Ambulatory Visit: Payer: 59 | Admitting: Physician Assistant

## 2016-11-06 DIAGNOSIS — E213 Hyperparathyroidism, unspecified: Secondary | ICD-10-CM | POA: Insufficient documentation

## 2016-11-06 DIAGNOSIS — E559 Vitamin D deficiency, unspecified: Secondary | ICD-10-CM

## 2016-11-06 HISTORY — DX: Vitamin D deficiency, unspecified: E55.9

## 2017-01-15 ENCOUNTER — Ambulatory Visit (INDEPENDENT_AMBULATORY_CARE_PROVIDER_SITE_OTHER): Payer: 59 | Admitting: Cardiovascular Disease

## 2017-01-15 ENCOUNTER — Encounter: Payer: Self-pay | Admitting: Cardiovascular Disease

## 2017-01-15 VITALS — BP 142/86 | HR 81 | Ht 67.0 in | Wt 221.8 lb

## 2017-01-15 DIAGNOSIS — E782 Mixed hyperlipidemia: Secondary | ICD-10-CM | POA: Diagnosis not present

## 2017-01-15 DIAGNOSIS — I2510 Atherosclerotic heart disease of native coronary artery without angina pectoris: Secondary | ICD-10-CM

## 2017-01-15 NOTE — Progress Notes (Signed)
Cardiology Office Note Date:  01/15/2017  Patient ID:  Danielle Fisher, Danielle Fisher 1955/08/07, MRN [id number] PCP:  Danielle Mclean, MD  Cardiologist:  Artemis Loyal  Problem list 1. Coronary artery disease-status post stenting of the distal RCA 2. Hyperlipidemia 3. Essential hypertension 4. Diabetes mellitus  Chief Complaint: f/u NSTEMI    Danielle Fisher is a 61 y.o. female with history of HTN, HLD, DM, recently diagnosed CAD (NSTEMI 04/2015 s/p s/p DES to distal RCA; residual moderate ostial rPDA but vessel overall small; EF 55-65%) who presents for post-hospital follow-up. She had recently been evaluated at the [facility] for chest pain which was deemed possibly related to acid reflux. Outpatient echo had been planned for "enlarged heart" on imaging. In the interim she presented to [facility] for symptoms and ruled in for NSTEMI with subsequent PCI as above on 04/21/15. She was started on ASA/Brilinta. During her hospital stay she was noted to have high blood pressure and ARB was added. Crestor was titrated further.   She comes in for f/u overall doing well. She does notice persistent fatigue that was present even before MI. No CP or SOB. She does wake herself up snoring from time to time. She has never been evaluated for OSA. She is awaiting word back from cardiac rehab in [facility] - interested in participating in their program. BP remains elevated. She did take her medicines already today around 6:30am. Recheck BP in clinic by me was 154/82.  Sep 24, 2015: Primary MD is at the [place] is a 79 yo who I met in Dec, 2016 with UAP. Cath revealed a tight right coronary stenosis. 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  She's done well since that time. She's not had any further episodes of chest pain or shortness of breath.  She has occasional palpitations that last for perhaps a few seconds. She saw her primary medical doctor at the [place] last week. She was noted have some mild locations of her  liver enzymes. She had a GI workup.  Dec. 6, 2017:  Doing well.  No angian  Checks BP at home.   BP fluctuates.   Is generally better than last year.   Is watching her salt .   Sept. 10, 2018"  Danielle Fisher is doing well.  No CP or dyspnea.  Getting some exercise  Recent lab work in the [place] Chol = 131 Trigs = 94 HDL = 45 LDL = 67   HbA1C was 9.5     Past Medical History:  Diagnosis Date  . Arthritis    "legs, arms" (04/20/2015)  . CAD (coronary artery disease)    a. NSTEMI -> LHC 04/21/15: s/p DES to distal RCA; residual moderate ostial rPDA but vessel is overall small; EF 55-65%.  [name] GERD (gastroesophageal reflux disease)   . Hyperlipidemia   . Hypertension   . Kidney stones   . Obesity   . Type II diabetes mellitus ([place])     Past Surgical History:  Procedure Laterality Date  . CARDIAC CATHETERIZATION N/A 04/21/2015   Procedure: Left Heart Cath and Coronary Angiography;  Surgeon: Wellington Hampshire, MD;  Location: [place] [facility];  Service: Cardiovascular;  Laterality: N/A;  . CARDIAC CATHETERIZATION N/A 04/21/2015   Procedure: Coronary Stent Intervention;  Surgeon: Wellington Hampshire, MD;  Location: [place] [facility];  Service: Cardiovascular;  Laterality: N/A;  . CESAREAN SECTION  1980  . DILATION AND CURETTAGE OF UTERUS  1980's X 1  Current Outpatient Prescriptions  Medication Sig Dispense Refill  . amLODipine (NORVASC) 10 MG tablet Take 10 mg by mouth daily.    [name] aspirin EC 81 MG tablet Take 81 mg by mouth daily.    . carvedilol (COREG) 25 MG tablet Take 25 mg by mouth 2 (two) times daily with a meal.    . Cholecalciferol (VITAMIN D PO) Take 1 tablet by mouth daily.    . clopidogrel (PLAVIX) 75 MG tablet Take 1 tablet (75 mg total) by mouth daily. 30 tablet 11  . hydrochlorothiazide (HYDRODIURIL) 25 MG tablet Take 25 mg by mouth daily.    . insulin aspart (NOVOLOG) 100 UNIT/ML injection Inject 46 Units into the skin 2 (two) times daily before a meal.    .  losartan (COZAAR) 100 MG tablet Take 1 tablet (100 mg total) by mouth daily. 30 tablet 5  . nitroGLYCERIN (NITROSTAT) 0.4 MG SL tablet Place 1 tablet (0.4 mg total) under the tongue every 5 (five) minutes as needed for chest pain (up to 3 doses). 25 tablet 3  . potassium chloride SA (K-DUR,KLOR-CON) 20 MEQ tablet Take 1 tablet (20 mEq total) by mouth daily. 30 tablet 11  . rosuvastatin (CRESTOR) 20 MG tablet Take 1.5 tablets (30 mg total) by mouth daily. 45 tablet 11  . Semaglutide 0.25 or 0.5 MG/DOSE SOPN Inject 0.25 mg into the skin once a week.     No current facility-administered medications for this visit.     Allergies:   Patient has no known allergies.   Social History:  The patient  reports that she has never smoked. She has never used smokeless tobacco. She reports that she does not drink alcohol or use drugs.   Family History:  The patient's family history includes Breast cancer in her sister; Diabetes in her father, sister, and sister; Heart disease in her mother; Stroke in her father and mother.  ROS:  Please see the history of present illness.    All other systems are reviewed and otherwise negative.   PHYSICAL EXAM:  VS:  BP (!) 142/86   Pulse 81   Ht 5' 7" (1.702 m)   Wt 221 lb 12.8 oz (100.6 kg)   BMI 34.74 kg/m  BMI: Body mass index is 34.74 kg/m. Recheck 154/82 Well nourished, well developed AAF, in no acute distress  HEENT: normocephalic, atraumatic  Neck: no JVD, carotid bruits or masses Cardiac:  normal S1, S2; RR; no murmurs, rubs, or gallops Lungs:  clear to auscultation bilaterally, no wheezing, rhonchi or rales  Abd: soft, nontender, no hepatomegaly, + BS MS: no deformity or atrophy.  Ext:  Trace leg edema ,  Skin: warm and dry, no rash Neuro:  moves all extremities spontaneously, no focal abnormalities noted, follows commands Psych: euthymic mood, full affect   EKG:   Sept. 10, 2018:   NSR at 81  Recent Labs: 04/12/2016: ALT 17 05/31/2016: BUN 24;  Creatinine, Ser 0.93; Potassium 4.8; Sodium 137  04/12/2016: Cholesterol 117; HDL 38; LDL Cholesterol 65; Total CHOL/HDL Ratio 3.1; Triglycerides 69; VLDL 14   CrCl cannot be calculated (Patient's most recent lab result is older than the maximum 21 days allowed.).   Wt Readings from Last 3 Encounters:  01/15/17 221 lb 12.8 oz (100.6 kg)  04/12/16 230 lb 1.9 oz (104.4 kg)  02/21/16 228 lb (103.4 kg)     Other studies reviewed: Additional studies/records reviewed today include: summarized above  ASSESSMENT AND PLAN:  1. CAD with recent NSTEMI  as above - overall stable post-MI.   Continue plavix        2. Essential HTN - BP is better but is still slightly elevated.   Continue diet and exercise  3. HLD - continue statin.  We'll check fasting labs today.  4. Fatigue - continue exercise, further eval per her primary MD    Mertie Moores, MD  01/15/2017 4:44 PM    [address],  [place], [place]  [address] Pager [phone] Phone: [phone]; Fax: [phone]

## 2017-01-15 NOTE — Patient Instructions (Signed)

## 2017-03-26 ENCOUNTER — Other Ambulatory Visit: Payer: Self-pay | Admitting: Family Medicine

## 2017-03-26 DIAGNOSIS — E785 Hyperlipidemia, unspecified: Secondary | ICD-10-CM

## 2017-05-11 ENCOUNTER — Other Ambulatory Visit: Payer: Self-pay | Admitting: Cardiovascular Disease

## 2017-05-21 ENCOUNTER — Other Ambulatory Visit: Payer: Self-pay | Admitting: Cardiovascular Disease

## 2017-09-21 ENCOUNTER — Ambulatory Visit: Payer: 59 | Admitting: Cardiovascular Disease

## 2017-09-21 ENCOUNTER — Encounter (INDEPENDENT_AMBULATORY_CARE_PROVIDER_SITE_OTHER): Payer: Self-pay

## 2017-09-21 ENCOUNTER — Encounter: Payer: Self-pay | Admitting: Cardiovascular Disease

## 2017-09-21 VITALS — BP 128/84 | HR 83 | Ht 67.0 in | Wt 222.8 lb

## 2017-09-21 DIAGNOSIS — I10 Essential (primary) hypertension: Secondary | ICD-10-CM | POA: Diagnosis not present

## 2017-09-21 DIAGNOSIS — I2510 Atherosclerotic heart disease of native coronary artery without angina pectoris: Secondary | ICD-10-CM

## 2017-09-21 NOTE — Progress Notes (Signed)
Cardiology Office Note Date:  09/21/2017  Patient ID:  Danielle Fisher, Danielle Fisher 24-Jun-1955, MRN [id number] PCP:  Darreld Mclean, MD  Cardiologist:  Amairani Shuey  Problem list 1. Coronary artery disease-status post stenting of the distal RCA 2. Hyperlipidemia 3. Essential hypertension 4. Diabetes mellitus  Chief Complaint: f/u NSTEMI    Danielle Fisher is a 62 y.o. female with history of HTN, HLD, DM, recently diagnosed CAD (NSTEMI 04/2015 s/p s/p DES to distal RCA; residual moderate ostial rPDA but vessel overall small; EF 55-65%) who presents for post-hospital follow-up. She had recently been evaluated at the [facility] for chest pain which was deemed possibly related to acid reflux. Outpatient echo had been planned for "enlarged heart" on imaging. In the interim she presented to [facility] for symptoms and ruled in for NSTEMI with subsequent PCI as above on 04/21/15. She was started on ASA/Brilinta. During her hospital stay she was noted to have high blood pressure and ARB was added. Crestor was titrated further.   She comes in for f/u overall doing well. She does notice persistent fatigue that was present even before MI. No CP or SOB. She does wake herself up snoring from time to time. She has never been evaluated for OSA. She is awaiting word back from cardiac rehab in [facility] - interested in participating in their program. BP remains elevated. She did take her medicines already today around 6:30am. Recheck BP in clinic by me was 154/82.  Sep 24, 2015: Primary MD is at the [place] is a 37 yo who I met in Dec, 2016 with UAP. Cath revealed a tight right coronary stenosis. 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  She's done well since that time. She's not had any further episodes of chest pain or shortness of breath.  She has occasional palpitations that last for perhaps a few seconds. She saw her primary medical doctor at the [place] last week. She was noted have some mild locations of her  liver enzymes. She had a GI workup.  Dec. 6, 2017:  Doing well.  No angian  Checks BP at home.   BP fluctuates.   Is generally better than last year.   Is watching her salt .   Sept. 10, 2018"  Danielle Fisher is doing well.  No CP or dyspnea.  Getting some exercise  Recent lab work in the [place] Chol = 131 Trigs = 94 HDL = 45 LDL = 67   HbA1C was 9.5   Sep 21, 2017:  Danielle Fisher is doing well.   No further CP. HbA1C is now 7.5.  Sees Dr. Tamala Julian at [facility]  Is exercising , is watching her diet  Had some vertigo  - has mostly resolved.   Past Medical History:  Diagnosis Date  . Arthritis    "legs, arms" (04/20/2015)  . CAD (coronary artery disease)    a. NSTEMI -> LHC 04/21/15: s/p DES to distal RCA; residual moderate ostial rPDA but vessel is overall small; EF 55-65%.  Danielle Fisher GERD (gastroesophageal reflux disease)   . Hyperlipidemia   . Hypertension   . Kidney stones   . Obesity   . Type II diabetes mellitus ([place])     Past Surgical History:  Procedure Laterality Date  . CARDIAC CATHETERIZATION N/A 04/21/2015   Procedure: Left Heart Cath and Coronary Angiography;  Surgeon: Wellington Hampshire, MD;  Location: [place] [facility];  Service: Cardiovascular;  Laterality: N/A;  . CARDIAC CATHETERIZATION N/A 04/21/2015  Procedure: Coronary Stent Intervention;  Surgeon: Wellington Hampshire, MD;  Location: [place] [facility];  Service: Cardiovascular;  Laterality: N/A;  . CESAREAN SECTION  1980  . DILATION AND CURETTAGE OF UTERUS  1980's X 1    Current Outpatient Medications  Medication Sig Dispense Refill  . amLODipine (NORVASC) 10 MG tablet Take 10 mg by mouth daily.    Danielle Fisher aspirin EC 81 MG tablet Take 81 mg by mouth daily.    . carvedilol (COREG) 25 MG tablet TAKE ONE TABLET BY MOUTH TWICE DAILY 180 tablet 2  . clopidogrel (PLAVIX) 75 MG tablet TAKE ONE TABLET BY MOUTH ONCE DAILY 90 tablet 2  . hydrochlorothiazide (HYDRODIURIL) 25 MG tablet Take 25 mg by mouth daily.    .  insulin aspart (NOVOLOG) 100 UNIT/ML injection Inject 46 Units into the skin 2 (two) times daily before a meal.    . losartan (COZAAR) 100 MG tablet Take 1 tablet (100 mg total) by mouth daily. 30 tablet 5  . nitroGLYCERIN (NITROSTAT) 0.4 MG SL tablet Place 1 tablet (0.4 mg total) under the tongue every 5 (five) minutes as needed for chest pain (up to 3 doses). 25 tablet 3  . rosuvastatin (CRESTOR) 20 MG tablet TAKE ONE & ONE-HALF TABLETS BY MOUTH ONCE DAILY 45 tablet 11  . Semaglutide 0.25 or 0.5 MG/DOSE SOPN Inject 0.25 mg into the skin once a week.     No current facility-administered medications for this visit.     Allergies:   Patient has no known allergies.   Social History:  The patient  reports that she has never smoked. She has never used smokeless tobacco. She reports that she does not drink alcohol or use drugs.   Family History:  The patient's family history includes Breast cancer in her sister; Diabetes in her father, sister, and sister; Heart disease in her mother; Stroke in her father and mother.  ROS:  Noted in current hx   Physical Exam: Blood pressure 128/84, pulse 83, height 5' 7"  (1.702 m), weight 222 lb 12.8 oz (101.1 kg), SpO2 98 %.  GEN:   Middle age, mildly obese female, NAD  HEENT: Normal NECK: No JVD; No carotid bruits LYMPHATICS: No lymphadenopathy CARDIAC: RR, no murmurs, rubs, gallops RESPIRATORY:  Clear to auscultation without rales, wheezing or rhonchi  ABDOMEN: Soft, non-tender, non-distended MUSCULOSKELETAL:  No edema; No deformity  SKIN: Warm and dry NEUROLOGIC:  Alert and oriented x 3    EKG:   Sept. 10, 2018:   NSR at 81  Recent Labs: No results found for requested labs within last 8760 hours.  No results found for requested labs within last 8760 hours.   CrCl cannot be calculated (Patient's most recent lab result is older than the maximum 21 days allowed.).   Wt Readings from Last 3 Encounters:  09/21/17 222 lb 12.8 oz (101.1 kg)    01/15/17 221 lb 12.8 oz (100.6 kg)  04/12/16 230 lb 1.9 oz (104.4 kg)     Other studies reviewed: Additional studies/records reviewed today include: summarized above  ASSESSMENT AND PLAN:  1. CAD with recent NSTEMI as above - overall stable post-MI.   Continue plavix        2.  Essential HTN -blood pressure remains elevated.  She is on maximum dose carvedilol, amlodipine, losartan and the correct dose of HCTZ. I think should benefit from weight loss.  Continue with diet, exercise, weight loss efforts.  2. HLD -continue rosuvastatin.  Check labs today.  Fatigue -    Mertie Moores, MD  09/21/2017 4:41 PM    [facility] [place],  [address], [place]  [address] Pager [phone] Phone: [phone]; Fax: [phone]

## 2017-09-21 NOTE — Patient Instructions (Signed)
Your physician recommends that you continue on your current medications as directed. Please refer to the Current Medication list given to you today. Your physician recommends that you return for lab work today (bmet, lipids, liver) Your physician wants you to follow-up in: 6 months with Dr. Elease Hashimoto. You will receive a reminder letter in the mail two months in advance. If you don't receive a letter, please call our office to schedule the follow-up appointment.

## 2017-09-22 LAB — HEPATIC FUNCTION PANEL
ALBUMIN: 4.4 g/dL (ref 3.6–4.8)
ALK PHOS: 171 IU/L — AB (ref 39–117)
ALT: 18 IU/L (ref 0–32)
AST: 19 IU/L (ref 0–40)
Bilirubin Total: 0.3 mg/dL (ref 0.0–1.2)
Bilirubin, Direct: 0.1 mg/dL (ref 0.00–0.40)
TOTAL PROTEIN: 6.7 g/dL (ref 6.0–8.5)

## 2017-09-22 LAB — LIPID PANEL
CHOL/HDL RATIO: 3.1 ratio (ref 0.0–4.4)
CHOLESTEROL TOTAL: 150 mg/dL (ref 100–199)
HDL: 49 mg/dL (ref >39)
LDL Calculated: 87 mg/dL (ref 0–99)
TRIGLYCERIDES: 70 mg/dL (ref 0–149)
VLDL CHOLESTEROL CAL: 14 mg/dL (ref 5–40)

## 2017-09-22 LAB — BASIC METABOLIC PANEL
BUN / CREAT RATIO: 24 (ref 12–28)
BUN: 14 mg/dL (ref 8–27)
CHLORIDE: 105 mmol/L (ref 96–106)
CO2: 20 mmol/L (ref 20–29)
Calcium: 10.7 mg/dL — ABNORMAL HIGH (ref 8.7–10.3)
Creatinine, Ser: 0.59 mg/dL (ref 0.57–1.00)
GFR calc Af Amer: 113 mL/min/{1.73_m2} (ref >59)
GFR calc non Af Amer: 98 mL/min/{1.73_m2} (ref >59)
GLUCOSE: 69 mg/dL (ref 65–99)
Potassium: 4.3 mmol/L (ref 3.5–5.2)
SODIUM: 143 mmol/L (ref 134–144)

## 2018-02-22 DIAGNOSIS — H608X3 Other otitis externa, bilateral: Secondary | ICD-10-CM | POA: Insufficient documentation

## 2018-03-01 ENCOUNTER — Other Ambulatory Visit: Payer: Self-pay | Admitting: Cardiovascular Disease

## 2018-04-07 ENCOUNTER — Other Ambulatory Visit: Payer: Self-pay | Admitting: Cardiovascular Disease

## 2018-04-09 ENCOUNTER — Other Ambulatory Visit: Payer: Self-pay | Admitting: Cardiovascular Disease

## 2018-04-09 MED ORDER — CLOPIDOGREL BISULFATE 75 MG PO TABS: 75.0000 mg | ORAL_TABLET | Freq: Every day | ORAL | 1 refills | Status: DC

## 2018-04-09 NOTE — Telephone Encounter (Signed)
Outpatient Medication Detail    Disp Refills Start End   clopidogrel (PLAVIX) 75 MG tablet 90 tablet 1 04/09/2018    Sig - Route: Take 1 tablet (75 mg total) by mouth daily. - Oral   Sent to pharmacy as: clopidogrel (PLAVIX) 75 MG tablet   Notes to Pharmacy: Please consider 90 day supplies to promote better adherence   E-Prescribing Status: Receipt confirmed by pharmacy (04/09/2018 3:22 PM EST)   Pharmacy   [id number] - [place], [place] - [address]

## 2018-04-09 NOTE — Telephone Encounter (Signed)
Pt's medication was sent to pt's pharmacy as requested. Confirmation received.  °

## 2018-04-24 ENCOUNTER — Telehealth: Payer: Self-pay | Admitting: Cardiovascular Disease

## 2018-04-24 MED ORDER — CARVEDILOL 25 MG PO TABS: 25.0000 mg | ORAL_TABLET | Freq: Two times a day (BID) | ORAL | 3 refills | Status: DC

## 2018-04-24 MED ORDER — CLOPIDOGREL BISULFATE 75 MG PO TABS: 75.0000 mg | ORAL_TABLET | Freq: Every day | ORAL | 3 refills | Status: DC

## 2018-04-24 NOTE — Telephone Encounter (Signed)
New message   Pt c/o medication issue:  1. Name of Medication: carvedilol (COREG) 25 MG tablet   And    clopidogrel (PLAVIX) 75 MG tablet  2. How are you currently taking this medication (dosage and times per day)? Carvedilol twice daily and the clopidogrel one time daily  3. Are you having a reaction (difficulty breathing--STAT)?no   4. What is your medication issue? Patient states that she needs a [facility] both medications [facility]. Please send to Dr. Cleta Albertsorazon Mulles. The fax # is [phone].

## 2018-04-24 NOTE — Telephone Encounter (Signed)
Refills for Plavix and Carvedilol placed in HIM to be faxed to [phone] per request

## 2018-04-29 ENCOUNTER — Telehealth: Payer: Self-pay | Admitting: Cardiovascular Disease

## 2018-04-29 NOTE — Telephone Encounter (Signed)
New Message   Pt c/o medication issue:  1. Name of Medication:clopidogrel (PLAVIX) 75 MG tablet    2. How are you currently taking this medication (dosage and times per day)?   3. Are you having a reaction (difficulty breathing--STAT)?   4. What is your medication issue? Erin with the [facility] is requesting documentation as to why the patient was started on Plavix to [facility] at [phone]

## 2018-04-29 NOTE — Telephone Encounter (Signed)
Patient has drug-eluting stent  Most recent office visit note faxed to [phone]

## 2019-04-09 ENCOUNTER — Ambulatory Visit: Payer: 59 | Admitting: Cardiovascular Disease

## 2019-05-07 ENCOUNTER — Encounter: Payer: Self-pay | Admitting: *Deleted

## 2019-05-16 ENCOUNTER — Ambulatory Visit: Payer: 59 | Admitting: Cardiovascular Disease

## 2019-05-19 ENCOUNTER — Other Ambulatory Visit: Payer: Self-pay

## 2019-05-19 ENCOUNTER — Ambulatory Visit: Payer: Self-pay | Admitting: Cardiology

## 2019-05-19 NOTE — Progress Notes (Deleted)
Cardiology Office Note:    Date:  05/19/2019   ID:  Danielle Fisher, DOB 06-13-1955, MRN [id number]  PCP:  Pearline Cables, MD  Cardiologist:  Kristeen Miss, MD  Referring MD: Pearline Cables, MD   No chief complaint on file. ***  History of Present Illness:    Danielle Fisher is a 64 y.o. female with a past medical history significant for CAD s/p NSTEMI 04/2015 treated with DES to distal RCA, hypertension, hyperlipidemia, diabetes type 2.  She is followed for primary care at the [place].  She was last seen in the office by Dr. Elease Hashimoto on 09/21/2017 at which time she was doing well except for some vertigo that had mostly resolved.  A1c was noted to be 7.5 and was seeing Dr. Katrinka Blazing at [facility].  She was advised on weight loss.  The patient is here today for annual follow-up.   Cardiac studies   Echocardiogram 05/24/2015 Study Conclusions - Left ventricle: The cavity size was normal. There was mild   concentric hypertrophy. Systolic function was normal. The   estimated ejection fraction was in the range of 55% to 60%. Wall   motion was normal; there were no regional wall motion   abnormalities. Doppler parameters are consistent with elevated   ventricular end-diastolic filling pressure. - Left atrium: The atrium was mildly dilated. - Atrial septum: No defect or patent foramen ovale was identified.  Left Heart Cath and Coronary Angiography 04/21/2015  Conclusion  Mid RCA lesion, 30% stenosed.  Ost RPDA to RPDA lesion, 60% stenosed.  The left ventricular systolic function is normal.  Dist RCA lesion, 95% stenosed. Post intervention, there is a 0% residual stenosis.   1. Severe one-vessel coronary artery disease involving the distal right coronary artery. The right PDA has moderate ostial stenosis but the vessel is overall small. There is a large distal RV marginal that partially supplied the PDA distribution. 2. Normal LV systolic function. 3. Moderately to  severely elevated systemic pressure with moderately elevated left ventricular end-diastolic pressure. 4. Successful angioplasty and drug-eluting stent placement to the distal right coronary artery.  Recommendations: Dual antiplatelet therapy for at least 12 months. Aggressive control of risk factors including blood pressure control. I added losartan for hypertension.      Past Medical History:  Diagnosis Date  . Arthritis    "legs, arms" (04/20/2015)  . CAD (coronary artery disease)    a. NSTEMI -> LHC 04/21/15: s/p DES to distal RCA; residual moderate ostial rPDA but vessel is overall small; EF 55-65%.  [name] GERD (gastroesophageal reflux disease)   . Hyperlipidemia   . Hypertension   . Kidney stones   . Obesity   . Thyromegaly 12/21/2012   See CT chest 8/13 /14    . Type II diabetes mellitus ([facility])   . Vitamin D deficiency 11/06/2016    Past Surgical History:  Procedure Laterality Date  . CARDIAC CATHETERIZATION N/A 04/21/2015   Procedure: Left Heart Cath and Coronary Angiography;  Surgeon: Iran Ouch, MD;  Location: [facility];  Service: Cardiovascular;  Laterality: N/A;  . CARDIAC CATHETERIZATION N/A 04/21/2015   Procedure: Coronary Stent Intervention;  Surgeon: Iran Ouch, MD;  Location: [facility];  Service: Cardiovascular;  Laterality: N/A;  . CESAREAN SECTION  1980  . DILATION AND CURETTAGE OF UTERUS  1980's X 1    Current Medications: No outpatient medications have been marked as taking for the 05/19/19 encounter (Appointment) with Berton Bon, NP.  Allergies:   Patient has no known allergies.   Social History   Socioeconomic History  . Marital status: Single    Spouse name: Not on file  . Number of children: Not on file  . Years of education: Not on file  . Highest education level: Not on file  Occupational History  . Occupation: Ecologist: [employer]  Tobacco Use  . Smoking status: Never Smoker  . Smokeless  tobacco: Never Used  Substance and Sexual Activity  . Alcohol use: No  . Drug use: No  . Sexual activity: Never  Other Topics Concern  . Not on file  Social History Narrative  . Not on file   Social Determinants of Health   Financial Resource Strain:   . Difficulty of Paying Living Expenses: Not on file  Food Insecurity:   . Worried About Charity fundraiser in the Last Year: Not on file  . Ran Out of Food in the Last Year: Not on file  Transportation Needs:   . Lack of Transportation (Medical): Not on file  . Lack of Transportation (Non-Medical): Not on file  Physical Activity:   . Days of Exercise per Week: Not on file  . Minutes of Exercise per Session: Not on file  Stress:   . Feeling of Stress : Not on file  Social Connections:   . Frequency of Communication with Friends and Family: Not on file  . Frequency of Social Gatherings with Friends and Family: Not on file  . Attends Religious Services: Not on file  . Active Member of Clubs or Organizations: Not on file  . Attends Archivist Meetings: Not on file  . Marital Status: Not on file     Family History: The patient's ***family history includes Breast cancer in her sister; Diabetes in her father, sister, and sister; Heart disease in her mother; Stroke in her father and mother. ROS:   Please see the history of present illness.    *** All other systems reviewed and are negative.   EKG:  EKG is *** ordered today.  The ekg ordered today demonstrates ***  Recent Labs: No results found for requested labs within last 8760 hours.   Recent Lipid Panel    Component Value Date/Time   CHOL 150 09/21/2017 1653   TRIG 70 09/21/2017 1653   HDL 49 09/21/2017 1653   CHOLHDL 3.1 09/21/2017 1653   CHOLHDL 3.1 04/12/2016 0912   VLDL 14 04/12/2016 0912   LDLCALC 87 09/21/2017 1653    Physical Exam:    VS:  There were no vitals taken for this visit.    Wt Readings from Last 6 Encounters:  09/21/17 222 lb 12.8  oz (101.1 kg)  01/15/17 221 lb 12.8 oz (100.6 kg)  04/12/16 230 lb 1.9 oz (104.4 kg)  02/21/16 228 lb (103.4 kg)  09/24/15 235 lb 12.8 oz (107 kg)  04/29/15 237 lb 6.4 oz (107.7 kg)     Physical Exam***   ASSESSMENT:    1. Coronary artery disease involving native coronary artery of native heart without angina pectoris   2. Essential hypertension   3. Mixed hyperlipidemia    PLAN:    In order of problems listed above:  CAD -S/p NSTEMI and DES to RCA in 2016. -Continues on medical therapy with aspirin, Plavix, beta-blocker and statin.  Essential hypertension/hypertensive heart disease -Elevated ventricular end-diastolic filling pressure on echocardiogram in 2017 -Goal BP less than 130/80 -On maximum doses of her  medications including carvedilol, amlodipine, losartan and HCTZ. -We will check metabolic panel today  Hyperlipidemia, LDL goal <70 -On rosuvastatin 20 mg daily -Last lipid panel in 09/2017 showed LDL of 87.  Will update lipid panel today.  Obesity        Medication Adjustments/Labs and Tests Ordered: Current medicines are reviewed at length with the patient today.  Concerns regarding medicines are outlined above. Labs and tests ordered and medication changes are outlined in the patient instructions below:  Patient Instructions  Lifestyle Modifications to Prevent and Treat Heart Disease -Recommend heart healthy/Mediterranean diet, with whole grains, fruits, vegetables, fish, lean meats, nuts, olive oil and avocado oil.  -Limit salt intake to less than 2000 mg per day.  -Recommend moderate walking, starting slowly with a few minutes and working up to 3-5 times/week for 30-50 minutes each session. Aim for at least 150 minutes.week. Goal should be pace of 3 miles/hours, or walking 1.5 miles in 30 minutes -Recommend avoidance of tobacco products. Avoid excess alcohol. -Keep blood pressure well controlled, ideally less than 130/80.      Signed, Berton Bon,  NP  05/19/2019 8:12 AM    [facility]

## 2019-07-09 ENCOUNTER — Ambulatory Visit: Payer: Medicare PPO | Admitting: Cardiovascular Disease

## 2019-07-09 ENCOUNTER — Encounter: Payer: Self-pay | Admitting: Cardiovascular Disease

## 2019-07-09 ENCOUNTER — Other Ambulatory Visit: Payer: Self-pay

## 2019-07-09 VITALS — BP 126/74 | HR 84 | Ht 67.0 in | Wt 216.0 lb

## 2019-07-09 DIAGNOSIS — I2510 Atherosclerotic heart disease of native coronary artery without angina pectoris: Secondary | ICD-10-CM | POA: Diagnosis not present

## 2019-07-09 DIAGNOSIS — E782 Mixed hyperlipidemia: Secondary | ICD-10-CM

## 2019-07-09 DIAGNOSIS — Z9861 Coronary angioplasty status: Secondary | ICD-10-CM | POA: Diagnosis not present

## 2019-07-09 LAB — LIPID PANEL
Chol/HDL Ratio: 2.7 ratio (ref 0.0–4.4)
Cholesterol, Total: 122 mg/dL (ref 100–199)
HDL: 45 mg/dL (ref >39)
LDL Chol Calc (NIH): 63 mg/dL (ref 0–99)
Triglycerides: 66 mg/dL (ref 0–149)
VLDL Cholesterol Cal: 14 mg/dL (ref 5–40)

## 2019-07-09 LAB — HEPATIC FUNCTION PANEL
ALT: 16 IU/L (ref 0–32)
AST: 20 IU/L (ref 0–40)
Albumin: 4.2 g/dL (ref 3.8–4.8)
Alkaline Phosphatase: 182 IU/L — ABNORMAL HIGH (ref 39–117)
Bilirubin Total: 0.3 mg/dL (ref 0.0–1.2)
Bilirubin, Direct: 0.1 mg/dL (ref 0.00–0.40)
Total Protein: 7 g/dL (ref 6.0–8.5)

## 2019-07-09 LAB — BASIC METABOLIC PANEL
BUN/Creatinine Ratio: 13 (ref 12–28)
BUN: 10 mg/dL (ref 8–27)
CO2: 22 mmol/L (ref 20–29)
Calcium: 10.4 mg/dL — ABNORMAL HIGH (ref 8.7–10.3)
Chloride: 105 mmol/L (ref 96–106)
Creatinine, Ser: 0.75 mg/dL (ref 0.57–1.00)
GFR calc Af Amer: 97 mL/min/{1.73_m2} (ref >59)
GFR calc non Af Amer: 84 mL/min/{1.73_m2} (ref >59)
Glucose: 104 mg/dL — ABNORMAL HIGH (ref 65–99)
Potassium: 4.2 mmol/L (ref 3.5–5.2)
Sodium: 141 mmol/L (ref 134–144)

## 2019-07-09 NOTE — Progress Notes (Signed)
Cardiology Office Note Date:  07/09/2019  Patient ID:  Danielle Fisher, Danielle Fisher 1956-04-01, MRN [id number] PCP:  Reeves Dam, MD  Cardiologist:  Paolina Karwowski  Problem list 1. Coronary artery disease-status post stenting of the distal RCA 2. Hyperlipidemia 3. Essential hypertension 4. Diabetes mellitus  Chief Complaint: f/u NSTEMI    Danielle Fisher is a 64 y.o. female with history of HTN, HLD, DM, recently diagnosed CAD (NSTEMI 04/2015 s/p s/p DES to distal RCA; residual moderate ostial rPDA but vessel overall small; EF 55-65%) who presents for post-hospital follow-up. She had recently been evaluated at the [facility] for chest pain which was deemed possibly related to acid reflux. Outpatient echo had been planned for "enlarged heart" on imaging. In the interim she presented to [facility] for symptoms and ruled in for NSTEMI with subsequent PCI as above on 04/21/15. She was started on ASA/Brilinta. During her hospital stay she was noted to have high blood pressure and ARB was added. Crestor was titrated further.   She comes in for f/u overall doing well. She does notice persistent fatigue that was present even before MI. No CP or SOB. She does wake herself up snoring from time to time. She has never been evaluated for OSA. She is awaiting word back from cardiac rehab in [facility] - interested in participating in their program. BP remains elevated. She did take her medicines already today around 6:30am. Recheck BP in clinic by me was 154/82.  Sep 24, 2015: Primary MD is at the [place] is a 21 yo who I met in Dec, 2016 with UAP. Cath revealed a tight right coronary stenosis. 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  She's done well since that time. She's not had any further episodes of chest pain or shortness of breath.  She has occasional palpitations that last for perhaps a few seconds. She saw her primary medical doctor at the [place] last week. She was noted have some mild locations of her  liver enzymes. She had a GI workup.   Dec. 6, 2017:  Doing well.  No angian  Checks BP at home.   BP fluctuates.   Is generally better than last year.   Is watching her salt .   Sept. 10, 2018"  Danielle Fisher is doing well.  No CP or dyspnea.  Getting some exercise  Recent lab work in the [place] Chol = 131 Trigs = 94 HDL = 45 LDL = 67   HbA1C was 9.5   Sep 21, 2017:  Danielle Fisher is doing well.   No further CP. HbA1C is now 7.5.  Sees Dr. Tamala Julian at [facility]  Is exercising , is watching her diet  Had some vertigo  - has mostly resolved.   July 09, 2019:  Danielle Fisher is seen today for follow-up visit.   She has a history of coronary artery disease with a non-ST segment elevation and stenting of the right coronary artery in 2016.  She was last seen in May, 2019.  She has a history of diabetes.  Her hemoglobin A1c has been elevated. Wt today is 216 Exercising  Some.   Labs at the [place] recently .   We do not have access to those   Past Medical History:  Diagnosis Date  . Arthritis    "legs, arms" (04/20/2015)  . CAD (coronary artery disease)    a. NSTEMI -> LHC 04/21/15: s/p DES to distal RCA; residual moderate ostial rPDA but vessel is overall small;  EF 55-65%.  [name] GERD (gastroesophageal reflux disease)   . Hyperlipidemia   . Hypertension   . Kidney stones   . Obesity   . Thyromegaly 12/21/2012   See CT chest 8/13 /14    . Type II diabetes mellitus ([place])   . Vitamin D deficiency 11/06/2016    Past Surgical History:  Procedure Laterality Date  . CARDIAC CATHETERIZATION N/A 04/21/2015   Procedure: Left Heart Cath and Coronary Angiography;  Surgeon: Wellington Hampshire, MD;  Location: [place] CV [facility];  Service: Cardiovascular;  Laterality: N/A;  . CARDIAC CATHETERIZATION N/A 04/21/2015   Procedure: Coronary Stent Intervention;  Surgeon: Wellington Hampshire, MD;  Location: [place] CV LAB;  Service: Cardiovascular;  Laterality: N/A;  . CESAREAN SECTION  1980  . DILATION AND  CURETTAGE OF UTERUS  1980's X 1    Current Outpatient Medications  Medication Sig Dispense Refill  . amLODipine (NORVASC) 10 MG tablet Take 10 mg by mouth daily.    [name] aspirin EC 81 MG tablet Take 81 mg by mouth daily.    . carvedilol (COREG) 25 MG tablet Take 1 tablet (25 mg total) by mouth 2 (two) times daily. 180 tablet 3  . clopidogrel (PLAVIX) 75 MG tablet Take 1 tablet (75 mg total) by mouth daily. 90 tablet 3  . glucose blood (ONETOUCH ULTRA) test strip 1 each by Other route as needed.    . hydrochlorothiazide (HYDRODIURIL) 25 MG tablet Take 25 mg by mouth daily.    . insulin glargine (LANTUS) 100 UNIT/ML injection Inject 36 Units into the skin daily.    [name] losartan (COZAAR) 100 MG tablet Take 1 tablet (100 mg total) by mouth daily. 30 tablet 5  . nitroGLYCERIN (NITROSTAT) 0.4 MG SL tablet Place 1 tablet (0.4 mg total) under the tongue every 5 (five) minutes as needed for chest pain (up to 3 doses). 25 tablet 3  . rosuvastatin (CRESTOR) 20 MG tablet TAKE ONE & ONE-HALF TABLETS BY MOUTH ONCE DAILY 45 tablet 11  . rosuvastatin (CRESTOR) 40 MG tablet Take 40 mg by mouth daily.    . Semaglutide 0.25 or 0.5 MG/DOSE SOPN Inject 0.25 mg into the skin once a week.     No current facility-administered medications for this visit.    Allergies:   Patient has no known allergies.   Social History:  The patient  reports that she has never smoked. She has never used smokeless tobacco. She reports that she does not drink alcohol or use drugs.   Family History:  The patient's family history includes Breast cancer in her sister; Diabetes in her father, sister, and sister; Heart disease in her mother; Stroke in her father and mother.  ROS:  Noted in current hx    Physical Exam: Blood pressure 126/74, pulse 84, height _0  (1.702 m), weight 216 lb (98 kg), SpO2 98 %.  GEN:  Well nourished, well developed in no acute distress HEENT: Normal NECK: No JVD; No carotid bruits LYMPHATICS: No  lymphadenopathy CARDIAC: RRR,   RESPIRATORY:  Clear to auscultation without rales, wheezing or rhonchi  ABDOMEN: Soft, non-tender, non-distended MUSCULOSKELETAL:  No edema; No deformity  SKIN: Warm and dry NEUROLOGIC:  Alert and oriented x 3    EKG:   July 09, 2019: Normal sinus rhythm at 84.  No ST or T wave changes.   Recent Labs: No results found for requested labs within last 8760 hours.  No results found for requested labs within last 8760 hours.  CrCl cannot be calculated (Patient's most recent lab result is older than the maximum 21 days allowed.).   Wt Readings from Last 3 Encounters:  07/09/19 216 lb (98 kg)  09/21/17 222 lb 12.8 oz (101.1 kg)  01/15/17 221 lb 12.8 oz (100.6 kg)     Other studies reviewed: Additional studies/records reviewed today include: summarized above  ASSESSMENT AND PLAN:  1. CAD with recent NSTEMI -she status post stenting of her RCA year ago.  She is not had any episodes of chest pain.  Continue Plavix.       2.  Essential HTN -blood pressure is well controlled.  Continue current medications.  3.   HLD her lipids were checked back in 2019.  We will recheck lipids, liver enzymes, basic metabolic profile.  Continue rosuvastatin.  I will see her again in 1 year.  Mertie Moores, MD  07/09/2019 9:01 AM    [address],  [place], [place]  [address] Pager [phone] Phone: [phone]; Fax: [phone]

## 2019-07-09 NOTE — Patient Instructions (Signed)
Medication Instructions:   Your physician recommends that you continue on your current medications as directed. Please refer to the Current Medication list given to you today.  *If you need a refill on your cardiac medications before your next appointment, please call your pharmacy*   Lab Work:  TODAY--LIPIDS, LFTS, AND BMET  If you have labs (blood work) drawn today and your tests are completely normal, you will receive your results only by: [name] MyChart Message (if you have MyChart) OR . A paper copy in the mail If you have any lab test that is abnormal or we need to change your treatment, we will call you to review the results.    Follow-Up: At [facility], you and your health needs are our priority.  As part of our continuing mission to provide you with exceptional heart care, we have created designated Provider Care Teams.  These Care Teams include your primary Cardiologist (physician) and Advanced Practice Providers (APPs -  Physician Assistants and Nurse Practitioners) who all work together to provide you with the care you need, when you need it.  We recommend signing up for the patient portal called "MyChart".  Sign up information is provided on this After Visit Summary.  MyChart is used to connect with patients for Virtual Visits (Telemedicine).  Patients are able to view lab/test results, encounter notes, upcoming appointments, etc.  Non-urgent messages can be sent to your provider as well.   To learn more about what you can do with MyChart, go to [email or web address].    Your next appointment:   12 month(s)  The format for your next appointment:   In Person  Provider:   Kristeen Miss, MD

## 2020-07-08 ENCOUNTER — Encounter: Payer: Self-pay | Admitting: Cardiovascular Disease

## 2020-07-08 NOTE — Progress Notes (Signed)
Cardiology Office Note Date:  07/09/2020  Patient ID:  Danielle Fisher, Danielle Fisher [birth year], MRN [id number] PCP:  Reeves Dam, MD  Cardiologist:  Saina Waage  Problem list 1. Coronary artery disease-status post stenting of the distal RCA 2. Hyperlipidemia 3. Essential hypertension 4. Diabetes mellitus  Chief Complaint: f/u NSTEMI    Danielle Fisher is a 65 y.o. female with history of HTN, HLD, DM, recently diagnosed CAD (NSTEMI 04/2015 s/p s/p DES to distal RCA; residual moderate ostial rPDA but vessel overall small; EF 55-65%) who presents for post-hospital follow-up. She had recently been evaluated at the [facility] for chest pain which was deemed possibly related to acid reflux. Outpatient echo had been planned for "enlarged heart" on imaging. In the interim she presented to [facility] for symptoms and ruled in for NSTEMI with subsequent PCI as above on 04/21/15. She was started on ASA/Brilinta. During her hospital stay she was noted to have high blood pressure and ARB was added. Crestor was titrated further.   She comes in for f/u overall doing well. She does notice persistent fatigue that was present even before MI. No CP or SOB. She does wake herself up snoring from time to time. She has never been evaluated for OSA. She is awaiting word back from cardiac rehab in [facility] - interested in participating in their program. BP remains elevated. She did take her medicines already today around 6:30am. Recheck BP in clinic by me was 154/82.  Sep 24, 2015: Primary MD is at the [place] is a 11 yo who I met in Dec, 2016 with UAP. Cath revealed a tight right coronary stenosis. 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  She's done well since that time. She's not had any further episodes of chest pain or shortness of breath.  She has occasional palpitations that last for perhaps a few seconds. She saw her primary medical doctor at the [place] last week. She was noted have some mild locations of her  liver enzymes. She had a GI workup.   Dec. 6, 2017:  Doing well.  No angian  Checks BP at home.   BP fluctuates.   Is generally better than last year.   Is watching her salt .   Sept. 10, 2018"  Danielle Fisher is doing well.  No CP or dyspnea.  Getting some exercise  Recent lab work in the [place] Chol = 131 Trigs = 94 HDL = 45 LDL = 67   HbA1C was 9.5   Sep 21, 2017:  Danielle Fisher is doing well.   No further CP. HbA1C is now 7.5.  Sees Dr. Tamala Julian at [place]  Is exercising , is watching her diet  Had some vertigo  - has mostly resolved.   July 09, 2019:  Danielle Fisher is seen today for follow-up visit.   She has a history of coronary artery disease with a non-ST segment elevation and stenting of the right coronary artery in 2016.  She was last seen in May, 2019.  She has a history of diabetes.  Her hemoglobin A1c has been elevated. Wt today is 216 Exercising  Some.   Labs at the [place] recently .   We do not have access to those   July 09, 2020: Danielle Fisher is seen today for a follow-up visit.  She has a history of coronary artery disease.  She has a non-ST segment elevation myocardial infarction and had stenting of the right coronary artery in 2016.  She has a  history of diabetes mellitus.  She has a history of hyperlipidemia.  No CP or dyspnea . Is getting some exercise, walks several times a week  Wt is 199 lbs.   Down 17 lbs from last year  Her lipids were fairly well controlled last year.  She was seen by her [place] doctor and her rosuvastatin was stopped apparently for elevated liver enzymes.  I do not have any of those labs. I have asked Danielle Fisher to bring her labs to show [place] in the next week or so.  I strongly think that she needs to restart a PCSK9 inhibitor if she is indeed intolerant to rosuvastatin.  Another option would be Inclisiran   Past Medical History:  Diagnosis Date  . Arthritis    "legs, arms" (04/20/2015)  . CAD (coronary artery disease)    a. NSTEMI ->  LHC 04/21/15: s/p DES to distal RCA; residual moderate ostial rPDA but vessel is overall small; EF 55-65%.  [name] GERD (gastroesophageal reflux disease)   . Hyperlipidemia   . Hypertension   . Kidney stones   . Obesity   . Thyromegaly 12/21/2012   See CT chest 8/13 /14    . Type II diabetes mellitus ([place])   . Vitamin D deficiency 11/06/2016    Past Surgical History:  Procedure Laterality Date  . CARDIAC CATHETERIZATION N/A 04/21/2015   Procedure: Left Heart Cath and Coronary Angiography;  Surgeon: Wellington Hampshire, MD;  Location: [place] CV [facility];  Service: Cardiovascular;  Laterality: N/A;  . CARDIAC CATHETERIZATION N/A 04/21/2015   Procedure: Coronary Stent Intervention;  Surgeon: Wellington Hampshire, MD;  Location: [place] [facility];  Service: Cardiovascular;  Laterality: N/A;  . CESAREAN SECTION  1980  . DILATION AND CURETTAGE OF UTERUS  1980's X 1    Current Outpatient Medications  Medication Sig Dispense Refill  . amLODipine (NORVASC) 10 MG tablet Take 10 mg by mouth daily.    [name] aspirin EC 81 MG tablet Take 81 mg by mouth daily.    . carvedilol (COREG) 25 MG tablet Take 1 tablet (25 mg total) by mouth 2 (two) times daily. 180 tablet 3  . clopidogrel (PLAVIX) 75 MG tablet Take 1 tablet (75 mg total) by mouth daily. 90 tablet 3  . glucose blood (ONETOUCH ULTRA) test strip 1 each by Other route as needed.    . hydrochlorothiazide (HYDRODIURIL) 25 MG tablet Take 25 mg by mouth daily.    . insulin glargine (LANTUS) 100 UNIT/ML injection Inject 36 Units into the skin daily.    [name] losartan (COZAAR) 100 MG tablet Take 1 tablet (100 mg total) by mouth daily. 30 tablet 5  . nitroGLYCERIN (NITROSTAT) 0.4 MG SL tablet Place 1 tablet (0.4 mg total) under the tongue every 5 (five) minutes as needed for chest pain (up to 3 doses). 25 tablet 3  . Semaglutide 0.25 or 0.5 MG/DOSE SOPN Inject 0.25 mg into the skin once a week.     No current facility-administered medications for this visit.     Allergies:   Patient has no known allergies.   Social History:  The patient  reports that she has never smoked. She has never used smokeless tobacco. She reports that she does not drink alcohol and does not use drugs.   Family History:  The patient's family history includes Breast cancer in her sister; Diabetes in her father, sister, and sister; Heart disease in her mother; Stroke in her father and mother.  ROS:  Noted  in current hx   Physical Exam: Blood pressure 134/74, pulse 76, height 5' 7"  (1.702 m), weight 199 lb (90.3 kg), SpO2 99 %.  GEN:  Well nourished, well developed in no acute distress HEENT: Normal NECK: No JVD; No carotid bruits LYMPHATICS: No lymphadenopathy CARDIAC: RRR , no murmurs, rubs, gallops RESPIRATORY:  Clear to auscultation without rales, wheezing or rhonchi  ABDOMEN: Soft, non-tender, non-distended MUSCULOSKELETAL:  No edema; No deformity  SKIN: Warm and dry NEUROLOGIC:  Alert and oriented x 3    EKG:   July 09, 2020: Normal sinus rhythm at 76.  First-degree AV block.  No ST or T wave changes.   Recent Labs: No results found for requested labs within last 8760 hours.  No results found for requested labs within last 8760 hours.   CrCl cannot be calculated (Patient's most recent lab result is older than the maximum 21 days allowed.).   Wt Readings from Last 3 Encounters:  07/09/20 199 lb (90.3 kg)  07/09/19 216 lb (98 kg)  09/21/17 222 lb 12.8 oz (101.1 kg)     Other studies reviewed: Additional studies/records reviewed today include: summarized above  ASSESSMENT AND PLAN:  1. CAD with recent NSTEMI -she status post stenting of her RCA year ago.   She is not had any recurrent episodes of chest discomfort.  She is ambulating well.  She is lost 17 pounds since last year which is awesome.       2.  Essential HTN -blood pressure is well controlled.   3.   HLD : Her lipids were fairly well controlled last year.  She was seen by her [place] doctor and her rosuvastatin was stopped apparently for elevated liver enzymes.  I do not have any of those labs. I have asked Jaya to bring her labs to show [place] in the next week or so.  I strongly think that she needs to restart a PCSK9 inhibitor if she is indeed intolerant to rosuvastatin.  Another option would be Esaw Dace, MD  07/09/2020 8:42 AM    [facility] [place],  [address] [place], [place]  [address] Pager [phone] Phone: [phone]; Fax: [phone]

## 2020-07-09 ENCOUNTER — Ambulatory Visit: Payer: Medicare PPO | Admitting: Cardiovascular Disease

## 2020-07-09 ENCOUNTER — Encounter: Payer: Self-pay | Admitting: Cardiovascular Disease

## 2020-07-09 ENCOUNTER — Other Ambulatory Visit: Payer: Self-pay

## 2020-07-09 VITALS — BP 134/74 | HR 76 | Ht 67.0 in | Wt 199.0 lb

## 2020-07-09 DIAGNOSIS — I214 Non-ST elevation (NSTEMI) myocardial infarction: Secondary | ICD-10-CM | POA: Diagnosis not present

## 2020-07-09 LAB — BASIC METABOLIC PANEL
BUN/Creatinine Ratio: 23 (ref 12–28)
BUN: 16 mg/dL (ref 8–27)
CO2: 20 mmol/L (ref 20–29)
Calcium: 10.5 mg/dL — ABNORMAL HIGH (ref 8.7–10.3)
Chloride: 106 mmol/L (ref 96–106)
Creatinine, Ser: 0.71 mg/dL (ref 0.57–1.00)
Glucose: 81 mg/dL (ref 65–99)
Potassium: 4.5 mmol/L (ref 3.5–5.2)
Sodium: 141 mmol/L (ref 134–144)
eGFR: 94 mL/min/{1.73_m2} (ref >59)

## 2020-07-09 LAB — LIPID PANEL
Chol/HDL Ratio: 4.6 ratio — ABNORMAL HIGH (ref 0.0–4.4)
Cholesterol, Total: 255 mg/dL — ABNORMAL HIGH (ref 100–199)
HDL: 55 mg/dL (ref >39)
LDL Chol Calc (NIH): 185 mg/dL — ABNORMAL HIGH (ref 0–99)
Triglycerides: 87 mg/dL (ref 0–149)
VLDL Cholesterol Cal: 15 mg/dL (ref 5–40)

## 2020-07-09 LAB — ALT: ALT: 16 IU/L (ref 0–32)

## 2020-07-09 NOTE — Patient Instructions (Signed)
Medication Instructions:  Your physician recommends that you continue on your current medications as directed. Please refer to the Current Medication list given to you today.  *If you need a refill on your cardiac medications before your next appointment, please call your pharmacy*   Lab Work: TODAY: BMET, Lipids, ALT If you have labs (blood work) drawn today and your tests are completely normal, you will receive your results only by: [name] MyChart Message (if you have MyChart) OR . A paper copy in the mail If you have any lab test that is abnormal or we need to change your treatment, we will call you to review the results.   Testing/Procedures: none   Follow-Up: At [facility], you and your health needs are our priority.  As part of our continuing mission to provide you with exceptional heart care, we have created designated Provider Care Teams.  These Care Teams include your primary Cardiologist (physician) and Advanced Practice Providers (APPs -  Physician Assistants and Nurse Practitioners) who all work together to provide you with the care you need, when you need it.  We recommend signing up for the patient portal called "MyChart".  Sign up information is provided on this After Visit Summary.  MyChart is used to connect with patients for Virtual Visits (Telemedicine).  Patients are able to view lab/test results, encounter notes, upcoming appointments, etc.  Non-urgent messages can be sent to your provider as well.   To learn more about what you can do with MyChart, go to [email or web address].    Your next appointment:  10/08/20 at 8:15am    The format for your next appointment:   In Person  Provider:   Tereso Newcomer, PA-C

## 2020-07-12 ENCOUNTER — Telehealth: Payer: Self-pay

## 2020-07-12 DIAGNOSIS — E782 Mixed hyperlipidemia: Secondary | ICD-10-CM

## 2020-07-12 MED ORDER — ROSUVASTATIN CALCIUM 40 MG PO TABS: 40.0000 mg | ORAL_TABLET | Freq: Every day | ORAL | 3 refills | Status: DC

## 2020-07-12 NOTE — Telephone Encounter (Signed)
RN called and spoke with the patient and stated LDL is back up and is very elevated, per MD.    RN stated that the MD recommended  To  restart rosuvastin 40 mg a day, which the patient agreed to do.  There is some concern about elevated liver enz with rosuvastatin.  RN ordered her labs for 04/074/22 at 07:30am, and 11/11/20 at 8am; lipid, ALT and BMET; pt was told to fast, and stated no further questions or concerns.  Darl Pikes RN

## 2020-07-12 NOTE — Addendum Note (Signed)
Addended by: Elenora Gamma on: 07/12/2020 12:45 PM   Modules accepted: Orders

## 2020-07-12 NOTE — Telephone Encounter (Signed)
-----   Message from Vesta Mixer, MD sent at 07/09/2020  4:59 PM EST ----- LDL is back up and is very elevated. Please have her restart rosuvastin 40 mg a day  There is some concern about elevated liver enz with rosuvastatin  Will check lipid, ALT and BMP in 1 month and again in 3 months .

## 2020-08-12 ENCOUNTER — Other Ambulatory Visit: Payer: Medicare PPO

## 2020-08-12 ENCOUNTER — Other Ambulatory Visit: Payer: Self-pay

## 2020-08-12 DIAGNOSIS — E782 Mixed hyperlipidemia: Secondary | ICD-10-CM

## 2020-08-12 LAB — LIPID PANEL
Chol/HDL Ratio: 2.8 ratio (ref 0.0–4.4)
Cholesterol, Total: 131 mg/dL (ref 100–199)
HDL: 47 mg/dL (ref >39)
LDL Chol Calc (NIH): 71 mg/dL (ref 0–99)
Triglycerides: 63 mg/dL (ref 0–149)
VLDL Cholesterol Cal: 13 mg/dL (ref 5–40)

## 2020-08-12 LAB — BASIC METABOLIC PANEL
BUN/Creatinine Ratio: 15 (ref 12–28)
BUN: 17 mg/dL (ref 8–27)
CO2: 20 mmol/L (ref 20–29)
Calcium: 10.8 mg/dL — ABNORMAL HIGH (ref 8.7–10.3)
Chloride: 108 mmol/L — ABNORMAL HIGH (ref 96–106)
Creatinine, Ser: 1.12 mg/dL — ABNORMAL HIGH (ref 0.57–1.00)
Glucose: 100 mg/dL — ABNORMAL HIGH (ref 65–99)
Potassium: 4 mmol/L (ref 3.5–5.2)
Sodium: 141 mmol/L (ref 134–144)
eGFR: 54 mL/min/{1.73_m2} — ABNORMAL LOW (ref >59)

## 2020-08-12 LAB — ALT: ALT: 29 IU/L (ref 0–32)

## 2020-10-08 ENCOUNTER — Ambulatory Visit: Payer: Medicare PPO | Admitting: Physician Assistant

## 2020-11-11 ENCOUNTER — Other Ambulatory Visit: Payer: Medicare PPO

## 2020-11-11 ENCOUNTER — Other Ambulatory Visit: Payer: Self-pay

## 2020-11-11 DIAGNOSIS — E782 Mixed hyperlipidemia: Secondary | ICD-10-CM

## 2020-11-11 LAB — LIPID PANEL
Chol/HDL Ratio: 2.9 ratio (ref 0.0–4.4)
Cholesterol, Total: 135 mg/dL (ref 100–199)
HDL: 47 mg/dL (ref >39)
LDL Chol Calc (NIH): 74 mg/dL (ref 0–99)
Triglycerides: 68 mg/dL (ref 0–149)
VLDL Cholesterol Cal: 14 mg/dL (ref 5–40)

## 2020-11-11 LAB — BASIC METABOLIC PANEL
BUN/Creatinine Ratio: 15 (ref 12–28)
BUN: 47 mg/dL — ABNORMAL HIGH (ref 8–27)
CO2: 23 mmol/L (ref 20–29)
Calcium: 11.7 mg/dL — ABNORMAL HIGH (ref 8.7–10.3)
Chloride: 99 mmol/L (ref 96–106)
Creatinine, Ser: 3.17 mg/dL — ABNORMAL HIGH (ref 0.57–1.00)
Glucose: 93 mg/dL (ref 65–99)
Potassium: 3.5 mmol/L (ref 3.5–5.2)
Sodium: 140 mmol/L (ref 134–144)
eGFR: 16 mL/min/{1.73_m2} — ABNORMAL LOW (ref >59)

## 2020-11-11 LAB — ALT: ALT: 19 IU/L (ref 0–32)

## 2020-11-15 ENCOUNTER — Encounter (HOSPITAL_BASED_OUTPATIENT_CLINIC_OR_DEPARTMENT_OTHER): Payer: Self-pay | Admitting: Emergency Medicine

## 2020-11-15 ENCOUNTER — Telehealth: Payer: Self-pay | Admitting: Cardiovascular Disease

## 2020-11-15 ENCOUNTER — Inpatient Hospital Stay (HOSPITAL_BASED_OUTPATIENT_CLINIC_OR_DEPARTMENT_OTHER)
Admission: EM | Admit: 2020-11-15 | Discharge: 2020-11-19 | DRG: 682 | Disposition: A | Discharge status: Home / Self Care | Payer: No Typology Code available for payment source | Attending: Family Medicine | Admitting: Family Medicine

## 2020-11-15 ENCOUNTER — Other Ambulatory Visit: Payer: Self-pay

## 2020-11-15 ENCOUNTER — Emergency Department (HOSPITAL_BASED_OUTPATIENT_CLINIC_OR_DEPARTMENT_OTHER): Payer: No Typology Code available for payment source

## 2020-11-15 DIAGNOSIS — Z79899 Other long term (current) drug therapy: Secondary | ICD-10-CM | POA: Diagnosis not present

## 2020-11-15 DIAGNOSIS — T502X5A Adverse effect of carbonic-anhydrase inhibitors, benzothiadiazides and other diuretics, initial encounter: Secondary | ICD-10-CM | POA: Diagnosis present

## 2020-11-15 DIAGNOSIS — E559 Vitamin D deficiency, unspecified: Secondary | ICD-10-CM | POA: Diagnosis not present

## 2020-11-15 DIAGNOSIS — U071 COVID-19: Secondary | ICD-10-CM | POA: Diagnosis not present

## 2020-11-15 DIAGNOSIS — I2510 Atherosclerotic heart disease of native coronary artery without angina pectoris: Secondary | ICD-10-CM | POA: Diagnosis not present

## 2020-11-15 DIAGNOSIS — Z955 Presence of coronary angioplasty implant and graft: Secondary | ICD-10-CM

## 2020-11-15 DIAGNOSIS — I252 Old myocardial infarction: Secondary | ICD-10-CM

## 2020-11-15 DIAGNOSIS — K219 Gastro-esophageal reflux disease without esophagitis: Secondary | ICD-10-CM | POA: Diagnosis present

## 2020-11-15 DIAGNOSIS — E861 Hypovolemia: Secondary | ICD-10-CM | POA: Diagnosis present

## 2020-11-15 DIAGNOSIS — Z7902 Long term (current) use of antithrombotics/antiplatelets: Secondary | ICD-10-CM | POA: Diagnosis not present

## 2020-11-15 DIAGNOSIS — J1282 Pneumonia due to coronavirus disease 2019: Secondary | ICD-10-CM | POA: Diagnosis present

## 2020-11-15 DIAGNOSIS — E785 Hyperlipidemia, unspecified: Secondary | ICD-10-CM | POA: Diagnosis present

## 2020-11-15 DIAGNOSIS — N281 Cyst of kidney, acquired: Secondary | ICD-10-CM | POA: Diagnosis not present

## 2020-11-15 DIAGNOSIS — E119 Type 2 diabetes mellitus without complications: Secondary | ICD-10-CM

## 2020-11-15 DIAGNOSIS — Z7982 Long term (current) use of aspirin: Secondary | ICD-10-CM

## 2020-11-15 DIAGNOSIS — Z803 Family history of malignant neoplasm of breast: Secondary | ICD-10-CM

## 2020-11-15 DIAGNOSIS — N170 Acute kidney failure with tubular necrosis: Principal | ICD-10-CM | POA: Diagnosis present

## 2020-11-15 DIAGNOSIS — E876 Hypokalemia: Secondary | ICD-10-CM | POA: Diagnosis present

## 2020-11-15 DIAGNOSIS — E860 Dehydration: Secondary | ICD-10-CM | POA: Diagnosis not present

## 2020-11-15 DIAGNOSIS — N179 Acute kidney failure, unspecified: Secondary | ICD-10-CM | POA: Diagnosis present

## 2020-11-15 DIAGNOSIS — I10 Essential (primary) hypertension: Secondary | ICD-10-CM | POA: Diagnosis present

## 2020-11-15 DIAGNOSIS — I952 Hypotension due to drugs: Secondary | ICD-10-CM | POA: Diagnosis not present

## 2020-11-15 DIAGNOSIS — Z794 Long term (current) use of insulin: Secondary | ICD-10-CM

## 2020-11-15 DIAGNOSIS — E11649 Type 2 diabetes mellitus with hypoglycemia without coma: Secondary | ICD-10-CM | POA: Diagnosis not present

## 2020-11-15 DIAGNOSIS — N390 Urinary tract infection, site not specified: Secondary | ICD-10-CM

## 2020-11-15 DIAGNOSIS — Z8249 Family history of ischemic heart disease and other diseases of the circulatory system: Secondary | ICD-10-CM

## 2020-11-15 LAB — CBC WITH DIFFERENTIAL/PLATELET
Abs Immature Granulocytes: 0.04 10*3/uL (ref 0.00–0.07)
Basophils Absolute: 0 10*3/uL (ref 0.0–0.1)
Basophils Relative: 0 %
Eosinophils Absolute: 0.2 10*3/uL (ref 0.0–0.5)
Eosinophils Relative: 2 %
HCT: 37.8 % (ref 36.0–46.0)
Hemoglobin: 12.5 g/dL (ref 12.0–15.0)
Immature Granulocytes: 0 %
Lymphocytes Relative: 11 %
Lymphs Abs: 1.1 10*3/uL (ref 0.7–4.0)
MCH: 27.7 pg (ref 26.0–34.0)
MCHC: 33.1 g/dL (ref 30.0–36.0)
MCV: 83.8 fL (ref 80.0–100.0)
Monocytes Absolute: 1.3 10*3/uL — ABNORMAL HIGH (ref 0.1–1.0)
Monocytes Relative: 13 %
Neutro Abs: 7.3 10*3/uL (ref 1.7–7.7)
Neutrophils Relative %: 74 %
Platelets: 325 10*3/uL (ref 150–400)
RBC: 4.51 MIL/uL (ref 3.87–5.11)
RDW: 14.6 % (ref 11.5–15.5)
WBC: 9.9 10*3/uL (ref 4.0–10.5)
nRBC: 0 % (ref 0.0–0.2)

## 2020-11-15 LAB — COMPREHENSIVE METABOLIC PANEL
ALT: 37 U/L (ref 0–44)
AST: 37 U/L (ref 15–41)
Albumin: 3.6 g/dL (ref 3.5–5.0)
Alkaline Phosphatase: 153 U/L — ABNORMAL HIGH (ref 38–126)
Anion gap: 10 (ref 5–15)
BUN: 50 mg/dL — ABNORMAL HIGH (ref 8–23)
CO2: 26 mmol/L (ref 22–32)
Calcium: 10.1 mg/dL (ref 8.9–10.3)
Chloride: 103 mmol/L (ref 98–111)
Creatinine, Ser: 3.75 mg/dL — ABNORMAL HIGH (ref 0.44–1.00)
GFR, Estimated: 13 mL/min — ABNORMAL LOW (ref >60)
Glucose, Bld: 98 mg/dL (ref 70–99)
Potassium: 2.8 mmol/L — ABNORMAL LOW (ref 3.5–5.1)
Sodium: 139 mmol/L (ref 135–145)
Total Bilirubin: 0.4 mg/dL (ref 0.3–1.2)
Total Protein: 8 g/dL (ref 6.5–8.1)

## 2020-11-15 LAB — URINALYSIS, MICROSCOPIC (REFLEX)

## 2020-11-15 LAB — URINALYSIS, ROUTINE W REFLEX MICROSCOPIC
Bilirubin Urine: NEGATIVE
Glucose, UA: NEGATIVE mg/dL
Ketones, ur: NEGATIVE mg/dL
Leukocytes,Ua: NEGATIVE
Nitrite: NEGATIVE
Protein, ur: 100 mg/dL — AB
Specific Gravity, Urine: 1.02 (ref 1.005–1.030)
pH: 5.5 (ref 5.0–8.0)

## 2020-11-15 LAB — RESP PANEL BY RT-PCR (FLU A&B, COVID) ARPGX2
Influenza A by PCR: NEGATIVE
Influenza B by PCR: NEGATIVE
SARS Coronavirus 2 by RT PCR: POSITIVE — AB

## 2020-11-15 MED ORDER — SODIUM CHLORIDE 0.9 % IV SOLN
1.0000 g | INTRAVENOUS | Status: DC
  Administered 2020-11-15: 1 g via INTRAVENOUS
  Filled 2020-11-15: qty 10

## 2020-11-15 MED ORDER — SODIUM CHLORIDE 0.9 % IV SOLN: INTRAVENOUS | Status: DC

## 2020-11-15 MED ORDER — POTASSIUM CHLORIDE CRYS ER 20 MEQ PO TBCR
40.0000 meq | EXTENDED_RELEASE_TABLET | Freq: Once | ORAL | Status: AC
  Administered 2020-11-15: 40 meq via ORAL
  Filled 2020-11-15: qty 2

## 2020-11-15 NOTE — Telephone Encounter (Signed)
Pt is returning call regarding recent lab work on 11/11/20. Please advise pt further

## 2020-11-15 NOTE — Telephone Encounter (Signed)
I spoke with the pt and went over Dr. Harvie Bridge recommendations from her labwork and she [facility] and [facility] to go [facility]. I have reassured her that if that is what they want her to do she should go and they will have access to her labwork.

## 2020-11-15 NOTE — ED Triage Notes (Signed)
Pt states her having routine labs completed at the [facility] and by her cardiologist. Pt's cardiologist called her and told her to go to the ED for a creatinine of 3.12. Pt reports a back ache but no other symptoms. Pt reports normal urine output.

## 2020-11-15 NOTE — ED Provider Notes (Signed)
[facility] EMERGENCY DEPARTMENT Provider Note   CSN: [id number] Arrival date & time: 11/15/20  1932     History Chief Complaint  Patient presents with   Abnormal Lab    Danielle Fisher is a 65 y.o. female.  The history is provided by the patient.  Abnormal Lab Time since result:  4 days Patient referred by:  Specialist Resulting agency:  External Resulting agency details:  Cardiology Result type: chemistry   Chemistry:    Creatinine:  High Patient with HTN, hyperlipidemia and CAD presents with low back pain, lightheadedness and elevated creatinine.      Past Medical History:  Diagnosis Date   Arthritis    "legs, arms" (04/20/2015)   CAD (coronary artery disease)    a. NSTEMI -> LHC 04/21/15: s/p DES to distal RCA; residual moderate ostial rPDA but vessel is overall small; EF 55-65%.   GERD (gastroesophageal reflux disease)    Hyperlipidemia    Hypertension    Kidney stones    Obesity    Thyromegaly 12/21/2012   See CT chest 8/13 /14     Type II diabetes mellitus (HCC)    Vitamin D deficiency 11/06/2016    Patient Active Problem List   Diagnosis Date Noted   Chronic eczematous otitis externa of both ears 02/22/2018   Vitamin D deficiency 11/06/2016   Hyperparathyroidism (HCC) 11/06/2016   Hypercalcemia 10/19/2016   Coronary artery disease involving native coronary artery of native heart without angina pectoris 04/12/2016   CAD S/P percutaneous coronary angioplasty 04/22/2015   Obesity 04/22/2015   NSTEMI (non-ST elevated myocardial infarction) (HCC) 04/21/2015   HTN (hypertension) 04/21/2015   HLD (hyperlipidemia) 04/21/2015   Diabetes mellitus, insulin dependent (IDDM), uncontrolled 04/21/2015   Thyromegaly 12/21/2012   Cough 12/19/2012    Past Surgical History:  Procedure Laterality Date   CARDIAC CATHETERIZATION N/A 04/21/2015   Procedure: Left Heart Cath and Coronary Angiography;  Surgeon: Iran Ouch, MD;  [facility];  Service: Cardiovascular;  Laterality: N/A;   CARDIAC CATHETERIZATION N/A 04/21/2015   Procedure: Coronary Stent Intervention;  Surgeon: Iran Ouch, MD;  Location: [facility] INVAS[facility] CV LAB;  Service: Cardiovascular;  Laterality: N/A;   CESAREAN SECTION  1980   DILATION AND CURETTAGE OF UTERUS  1980's X 1     OB History   No obstetric history on file.     Family History  Problem Relation Age of Onset   Heart disease Mother    Stroke Mother    Breast cancer Sister    Diabetes Sister    Stroke Father    Diabetes Father    Diabetes Sister     Social History   Tobacco Use   Smoking status: Never   Smokeless tobacco: Never  Vaping Use   Vaping Use: Never used  Substance Use Topics   Alcohol use: No   Drug use: No    Home Medications Prior to Admission medications   Medication Sig Start Date End Date Taking? Authorizing Provider  amLODipine (NORVASC) 10 MG tablet Take 10 mg by mouth daily.    [provider]  aspirin EC 81 MG tablet Take 81 mg by mouth daily.    [provider]  carvedilol (COREG) 25 MG tablet Take 1 tablet (25 mg total) by mouth 2 (two) times daily. 04/24/18   Nahser, [name], MD  clopidogrel (PLAVIX) 75 MG tablet Take 1 tablet (75 mg total) by mouth daily. 04/24/18   Nahser, [name],  MD  glucose blood (ONETOUCH ULTRA) test strip 1 each by Other route as needed. 11/01/17   [provider]  hydrochlorothiazide (HYDRODIURIL) 25 MG tablet Take 25 mg by mouth daily.    [provider]  insulin glargine (LANTUS) 100 UNIT/ML injection Inject 36 Units into the skin daily.    [provider]  losartan (COZAAR) 100 MG tablet Take 1 tablet (100 mg total) by mouth daily. 04/29/15   Dunn, Tacey Ruiz, PA-C  nitroGLYCERIN (NITROSTAT) 0.4 MG SL tablet Place 1 tablet (0.4 mg total) under the tongue every 5 (five) minutes as needed for chest pain (up to 3 doses). 04/22/15   Dunn, Tacey Ruiz, PA-C  rosuvastatin (CRESTOR) 40 MG  tablet Take 1 tablet (40 mg total) by mouth daily. 07/12/20   Nahser, [name], MD  Semaglutide 0.25 or 0.5 MG/DOSE SOPN Inject 0.25 mg into the skin once a week. 12/21/16   [provider]    Allergies    Patient has no known allergies.  Review of Systems   Review of Systems  Constitutional:  Negative for fever.  HENT:  Negative for facial swelling.   Eyes:  Negative for redness.  Respiratory:  Negative for wheezing.   Cardiovascular:  Negative for chest pain and palpitations.  Gastrointestinal:  Negative for nausea and vomiting.  Musculoskeletal:        Low back pain   Skin:  Negative for rash.  Neurological:  Negative for facial asymmetry.  Psychiatric/Behavioral:  Negative for agitation.   All other systems reviewed and are negative.  Physical Exam Updated Vital Signs BP 129/67 (BP Location: Right Arm)   Pulse 73   Temp 99.1 F (37.3 C) (Oral)   Resp 20   Ht 5' 6.5" (1.689 m)   Wt 89.4 kg   SpO2 98%   BMI 31.32 kg/m   Physical Exam Vitals and nursing note reviewed.  Constitutional:      General: She is not in acute distress.    Appearance: Normal appearance.  HENT:     Head: Normocephalic and atraumatic.     Nose: Nose normal.  Eyes:     Conjunctiva/sclera: Conjunctivae normal.     Pupils: Pupils are equal, round, and reactive to light.  Cardiovascular:     Rate and Rhythm: Normal rate and regular rhythm.     Pulses: Normal pulses.     Heart sounds: Normal heart sounds.  Pulmonary:     Effort: Pulmonary effort is normal.     Breath sounds: Normal breath sounds.  Abdominal:     General: Abdomen is flat. Bowel sounds are normal.     Palpations: Abdomen is soft.     Tenderness: There is no abdominal tenderness. There is no guarding or rebound.  Musculoskeletal:        General: Normal range of motion.     Cervical back: Normal range of motion and neck supple.  Skin:    General: Skin is warm and dry.     Capillary Refill: Capillary refill takes less  than 2 seconds.  Neurological:     General: No focal deficit present.     Mental Status: She is alert and oriented to person, place, and time.     Deep Tendon Reflexes: Reflexes normal.  Psychiatric:        Mood and Affect: Mood normal.        Behavior: Behavior normal.    ED Results / Procedures / Treatments   Labs (all labs ordered  are listed, but only abnormal results are displayed) Results for orders placed or performed during the hospital encounter of 11/15/20  Resp Panel by RT-PCR (Flu A&B, Covid) Nasopharyngeal Swab   Specimen: Nasopharyngeal Swab; Nasopharyngeal(NP) swabs in vial transport medium  Result Value Ref Range   SARS Coronavirus 2 by RT PCR POSITIVE (A) NEGATIVE   Influenza A by PCR NEGATIVE NEGATIVE   Influenza B by PCR NEGATIVE NEGATIVE  CBC with Differential/Platelet  Result Value Ref Range   WBC 9.9 4.0 - 10.5 K/uL   RBC 4.51 3.87 - 5.11 MIL/uL   Hemoglobin 12.5 12.0 - 15.0 g/dL   HCT 42.5 95.6 - 38.7 %   MCV 83.8 80.0 - 100.0 fL   MCH 27.7 26.0 - 34.0 pg   MCHC 33.1 30.0 - 36.0 g/dL   RDW 56.4 33.2 - 95.1 %   Platelets 325 150 - 400 K/uL   nRBC 0.0 0.0 - 0.2 %   Neutrophils Relative % 74 %   Neutro Abs 7.3 1.7 - 7.7 K/uL   Lymphocytes Relative 11 %   Lymphs Abs 1.1 0.7 - 4.0 K/uL   Monocytes Relative 13 %   Monocytes Absolute 1.3 (H) 0.1 - 1.0 K/uL   Eosinophils Relative 2 %   Eosinophils Absolute 0.2 0.0 - 0.5 K/uL   Basophils Relative 0 %   Basophils Absolute 0.0 0.0 - 0.1 K/uL   Immature Granulocytes 0 %   Abs Immature Granulocytes 0.04 0.00 - 0.07 K/uL  Urinalysis, Routine w reflex microscopic Urine, Clean Catch  Result Value Ref Range   Color, Urine YELLOW YELLOW   APPearance HAZY (A) CLEAR   Specific Gravity, Urine 1.020 1.005 - 1.030   pH 5.5 5.0 - 8.0   Glucose, UA NEGATIVE NEGATIVE mg/dL   Hgb urine dipstick MODERATE (A) NEGATIVE   Bilirubin Urine NEGATIVE NEGATIVE   Ketones, ur NEGATIVE NEGATIVE mg/dL   Protein, ur 884 (A)  NEGATIVE mg/dL   Nitrite NEGATIVE NEGATIVE   Leukocytes,Ua NEGATIVE NEGATIVE  Urinalysis, Microscopic (reflex)  Result Value Ref Range   RBC / HPF 0-5 0 - 5 RBC/hpf   WBC, UA 0-5 0 - 5 WBC/hpf   Bacteria, UA MANY (A) NONE SEEN   Squamous Epithelial / LPF 0-5 0 - 5   Non Squamous Epithelial PRESENT (A) NONE SEEN   Granular Casts, UA PRESENT   Comprehensive metabolic panel  Result Value Ref Range   Sodium 139 135 - 145 mmol/L   Potassium 2.8 (L) 3.5 - 5.1 mmol/L   Chloride 103 98 - 111 mmol/L   CO2 26 22 - 32 mmol/L   Glucose, Bld 98 70 - 99 mg/dL   BUN 50 (H) 8 - 23 mg/dL   Creatinine, Ser 1.66 (H) 0.44 - 1.00 mg/dL   Calcium 06.3 8.9 - 01.6 mg/dL   Total Protein 8.0 6.5 - 8.1 g/dL   Albumin 3.6 3.5 - 5.0 g/dL   AST 37 15 - 41 U/L   ALT 37 0 - 44 U/L   Alkaline Phosphatase 153 (H) 38 - 126 U/L   Total Bilirubin 0.4 0.3 - 1.2 mg/dL   GFR, Estimated 13 (L) >60 mL/min   Anion gap 10 5 - 15   No results found.   Radiology No results found.  Procedures Procedures   Medications Ordered in ED Medications - No data to display  ED Course  I have reviewed the triage vital signs and the nursing notes.  Pertinent labs & imaging results that  were available during my care of the patient were reviewed by me and considered in my medical decision making (see chart for details).    Final Clinical Impression(s) / ED Diagnoses Final diagnoses:  COVID-19  Acute renal failure, unspecified acute renal failure type (HCC)   Acute renal failure with covid 19 and UTI, will admit to medicine for hydration and antibiotics.      Prima Rayner, MD 11/15/20 2135

## 2020-11-16 ENCOUNTER — Observation Stay (HOSPITAL_COMMUNITY): Payer: No Typology Code available for payment source

## 2020-11-16 DIAGNOSIS — U071 COVID-19: Secondary | ICD-10-CM | POA: Diagnosis present

## 2020-11-16 DIAGNOSIS — I2510 Atherosclerotic heart disease of native coronary artery without angina pectoris: Secondary | ICD-10-CM | POA: Diagnosis present

## 2020-11-16 DIAGNOSIS — Z79899 Other long term (current) drug therapy: Secondary | ICD-10-CM | POA: Diagnosis not present

## 2020-11-16 DIAGNOSIS — I10 Essential (primary) hypertension: Secondary | ICD-10-CM | POA: Diagnosis present

## 2020-11-16 DIAGNOSIS — Z955 Presence of coronary angioplasty implant and graft: Secondary | ICD-10-CM | POA: Diagnosis not present

## 2020-11-16 DIAGNOSIS — N179 Acute kidney failure, unspecified: Secondary | ICD-10-CM | POA: Diagnosis not present

## 2020-11-16 DIAGNOSIS — Z7902 Long term (current) use of antithrombotics/antiplatelets: Secondary | ICD-10-CM | POA: Diagnosis not present

## 2020-11-16 DIAGNOSIS — E785 Hyperlipidemia, unspecified: Secondary | ICD-10-CM | POA: Diagnosis present

## 2020-11-16 DIAGNOSIS — E11649 Type 2 diabetes mellitus with hypoglycemia without coma: Secondary | ICD-10-CM | POA: Diagnosis present

## 2020-11-16 DIAGNOSIS — K219 Gastro-esophageal reflux disease without esophagitis: Secondary | ICD-10-CM | POA: Diagnosis present

## 2020-11-16 DIAGNOSIS — T502X5A Adverse effect of carbonic-anhydrase inhibitors, benzothiadiazides and other diuretics, initial encounter: Secondary | ICD-10-CM | POA: Diagnosis present

## 2020-11-16 DIAGNOSIS — E861 Hypovolemia: Secondary | ICD-10-CM | POA: Diagnosis present

## 2020-11-16 DIAGNOSIS — N170 Acute kidney failure with tubular necrosis: Secondary | ICD-10-CM | POA: Diagnosis present

## 2020-11-16 DIAGNOSIS — Z794 Long term (current) use of insulin: Secondary | ICD-10-CM | POA: Diagnosis not present

## 2020-11-16 DIAGNOSIS — J1282 Pneumonia due to coronavirus disease 2019: Secondary | ICD-10-CM | POA: Diagnosis present

## 2020-11-16 DIAGNOSIS — I952 Hypotension due to drugs: Secondary | ICD-10-CM | POA: Diagnosis present

## 2020-11-16 DIAGNOSIS — E559 Vitamin D deficiency, unspecified: Secondary | ICD-10-CM | POA: Diagnosis present

## 2020-11-16 DIAGNOSIS — E876 Hypokalemia: Secondary | ICD-10-CM | POA: Diagnosis present

## 2020-11-16 DIAGNOSIS — Z7982 Long term (current) use of aspirin: Secondary | ICD-10-CM | POA: Diagnosis not present

## 2020-11-16 DIAGNOSIS — Z803 Family history of malignant neoplasm of breast: Secondary | ICD-10-CM | POA: Diagnosis not present

## 2020-11-16 DIAGNOSIS — N281 Cyst of kidney, acquired: Secondary | ICD-10-CM | POA: Diagnosis present

## 2020-11-16 DIAGNOSIS — E860 Dehydration: Secondary | ICD-10-CM | POA: Diagnosis present

## 2020-11-16 DIAGNOSIS — Z8249 Family history of ischemic heart disease and other diseases of the circulatory system: Secondary | ICD-10-CM | POA: Diagnosis not present

## 2020-11-16 DIAGNOSIS — I252 Old myocardial infarction: Secondary | ICD-10-CM | POA: Diagnosis not present

## 2020-11-16 LAB — HEMOGLOBIN A1C
Hgb A1c MFr Bld: 6.2 % — ABNORMAL HIGH (ref 4.8–5.6)
Mean Plasma Glucose: 131.24 mg/dL

## 2020-11-16 LAB — COMPREHENSIVE METABOLIC PANEL
ALT: 34 U/L (ref 0–44)
AST: 35 U/L (ref 15–41)
Albumin: 3.2 g/dL — ABNORMAL LOW (ref 3.5–5.0)
Alkaline Phosphatase: 124 U/L (ref 38–126)
Anion gap: 11 (ref 5–15)
BUN: 49 mg/dL — ABNORMAL HIGH (ref 8–23)
CO2: 22 mmol/L (ref 22–32)
Calcium: 10.1 mg/dL (ref 8.9–10.3)
Chloride: 109 mmol/L (ref 98–111)
Creatinine, Ser: 3.36 mg/dL — ABNORMAL HIGH (ref 0.44–1.00)
GFR, Estimated: 15 mL/min — ABNORMAL LOW (ref >60)
Glucose, Bld: 101 mg/dL — ABNORMAL HIGH (ref 70–99)
Potassium: 2.8 mmol/L — ABNORMAL LOW (ref 3.5–5.1)
Sodium: 142 mmol/L (ref 135–145)
Total Bilirubin: 0.5 mg/dL (ref 0.3–1.2)
Total Protein: 7.1 g/dL (ref 6.5–8.1)

## 2020-11-16 LAB — GLUCOSE, CAPILLARY
Glucose-Capillary: 99 mg/dL (ref 70–99)
Glucose-Capillary: 76 mg/dL (ref 70–99)
Glucose-Capillary: 105 mg/dL — ABNORMAL HIGH (ref 70–99)
Glucose-Capillary: 93 mg/dL (ref 70–99)
Glucose-Capillary: 97 mg/dL (ref 70–99)

## 2020-11-16 LAB — CBC
HCT: 32.8 % — ABNORMAL LOW (ref 36.0–46.0)
Hemoglobin: 10.6 g/dL — ABNORMAL LOW (ref 12.0–15.0)
MCH: 27.3 pg (ref 26.0–34.0)
MCHC: 32.3 g/dL (ref 30.0–36.0)
MCV: 84.5 fL (ref 80.0–100.0)
Platelets: 272 10*3/uL (ref 150–400)
RBC: 3.88 MIL/uL (ref 3.87–5.11)
RDW: 14.6 % (ref 11.5–15.5)
WBC: 9.6 10*3/uL (ref 4.0–10.5)
nRBC: 0 % (ref 0.0–0.2)

## 2020-11-16 LAB — CK: Total CK: 156 U/L (ref 38–234)

## 2020-11-16 LAB — D-DIMER, QUANTITATIVE: D-Dimer, Quant: 0.31 ug/mL-FEU (ref 0.00–0.50)

## 2020-11-16 LAB — CREATININE, SERUM
Creatinine, Ser: 3.32 mg/dL — ABNORMAL HIGH (ref 0.44–1.00)
GFR, Estimated: 15 mL/min — ABNORMAL LOW (ref >60)

## 2020-11-16 LAB — PHOSPHORUS: Phosphorus: 3.5 mg/dL (ref 2.5–4.6)

## 2020-11-16 LAB — C-REACTIVE PROTEIN: CRP: 1 mg/dL — ABNORMAL HIGH (ref <1.0)

## 2020-11-16 LAB — MAGNESIUM: Magnesium: 2.4 mg/dL (ref 1.7–2.4)

## 2020-11-16 LAB — FERRITIN: Ferritin: 199 ng/mL (ref 11–307)

## 2020-11-16 LAB — HIV ANTIBODY (ROUTINE TESTING W REFLEX): HIV Screen 4th Generation wRfx: NONREACTIVE

## 2020-11-16 MED ORDER — SODIUM CHLORIDE 0.9 % IV SOLN
100.0000 mg | Freq: Once | INTRAVENOUS | Status: AC
  Administered 2020-11-16: 100 mg via INTRAVENOUS
  Filled 2020-11-16: qty 20

## 2020-11-16 MED ORDER — AMLODIPINE BESYLATE 10 MG PO TABS
10.0000 mg | ORAL_TABLET | Freq: Every day | ORAL | Status: DC
  Administered 2020-11-16 – 2020-11-19 (×4): 10 mg via ORAL
  Filled 2020-11-16 (×4): qty 1

## 2020-11-16 MED ORDER — LACTATED RINGERS IV BOLUS
1000.0000 mL | Freq: Once | INTRAVENOUS | Status: AC
  Administered 2020-11-16: 1000 mL via INTRAVENOUS

## 2020-11-16 MED ORDER — POTASSIUM CHLORIDE 10 MEQ/100ML IV SOLN
10.0000 meq | INTRAVENOUS | Status: AC
  Administered 2020-11-16 (×4): 10 meq via INTRAVENOUS
  Filled 2020-11-16 (×4): qty 100

## 2020-11-16 MED ORDER — INSULIN GLARGINE 100 UNIT/ML ~~LOC~~ SOLN
25.0000 [IU] | Freq: Every day | SUBCUTANEOUS | Status: DC
  Administered 2020-11-16: 25 [IU] via SUBCUTANEOUS
  Administered 2020-11-17: 10 [IU] via SUBCUTANEOUS
  Filled 2020-11-16 (×2): qty 0.25

## 2020-11-16 MED ORDER — ROSUVASTATIN CALCIUM 20 MG PO TABS: 40.0000 mg | ORAL_TABLET | Freq: Every day | ORAL | Status: DC

## 2020-11-16 MED ORDER — LACTATED RINGERS IV SOLN: INTRAVENOUS | Status: DC

## 2020-11-16 MED ORDER — OXYCODONE HCL 5 MG PO TABS
5.0000 mg | ORAL_TABLET | Freq: Once | ORAL | Status: AC
  Administered 2020-11-16: 5 mg via ORAL
  Filled 2020-11-16: qty 1

## 2020-11-16 MED ORDER — ASPIRIN EC 81 MG PO TBEC
81.0000 mg | DELAYED_RELEASE_TABLET | Freq: Every day | ORAL | Status: DC
  Administered 2020-11-16 – 2020-11-19 (×4): 81 mg via ORAL
  Filled 2020-11-16 (×4): qty 1

## 2020-11-16 MED ORDER — ACETAMINOPHEN 325 MG PO TABS
650.0000 mg | ORAL_TABLET | Freq: Four times a day (QID) | ORAL | Status: DC | PRN
  Administered 2020-11-17: 650 mg via ORAL
  Filled 2020-11-16: qty 2

## 2020-11-16 MED ORDER — CARVEDILOL 25 MG PO TABS
25.0000 mg | ORAL_TABLET | Freq: Two times a day (BID) | ORAL | Status: DC
  Administered 2020-11-16 – 2020-11-19 (×8): 25 mg via ORAL
  Filled 2020-11-16 (×8): qty 1

## 2020-11-16 MED ORDER — POTASSIUM CHLORIDE CRYS ER 20 MEQ PO TBCR
40.0000 meq | EXTENDED_RELEASE_TABLET | Freq: Once | ORAL | Status: AC
  Administered 2020-11-16: 40 meq via ORAL
  Filled 2020-11-16: qty 2

## 2020-11-16 MED ORDER — INSULIN ASPART 100 UNIT/ML IJ SOLN
0.0000 [IU] | Freq: Three times a day (TID) | INTRAMUSCULAR | Status: DC
  Administered 2020-11-17 – 2020-11-18 (×3): 2 [IU] via SUBCUTANEOUS
  Administered 2020-11-19: 1 [IU] via SUBCUTANEOUS

## 2020-11-16 MED ORDER — CLOPIDOGREL BISULFATE 75 MG PO TABS
75.0000 mg | ORAL_TABLET | Freq: Every day | ORAL | Status: DC
  Administered 2020-11-16 – 2020-11-19 (×4): 75 mg via ORAL
  Filled 2020-11-16 (×4): qty 1

## 2020-11-16 MED ORDER — SODIUM CHLORIDE 0.9 % IV SOLN
100.0000 mg | Freq: Every day | INTRAVENOUS | Status: DC
  Administered 2020-11-17 – 2020-11-19 (×3): 100 mg via INTRAVENOUS
  Filled 2020-11-16 (×3): qty 20

## 2020-11-16 MED ORDER — INSULIN ASPART 100 UNIT/ML IJ SOLN
0.0000 [IU] | Freq: Every day | INTRAMUSCULAR | Status: DC
  Administered 2020-11-17: 2 [IU] via SUBCUTANEOUS
  Administered 2020-11-18: 3 [IU] via SUBCUTANEOUS

## 2020-11-16 MED ORDER — HEPARIN SODIUM (PORCINE) 5000 UNIT/ML IJ SOLN
5000.0000 [IU] | Freq: Three times a day (TID) | INTRAMUSCULAR | Status: DC
  Administered 2020-11-16 – 2020-11-19 (×10): 5000 [IU] via SUBCUTANEOUS
  Filled 2020-11-16 (×10): qty 1

## 2020-11-16 MED ORDER — BUTALBITAL-APAP-CAFFEINE 50-325-40 MG PO TABS
2.0000 | ORAL_TABLET | Freq: Once | ORAL | Status: AC
  Administered 2020-11-16: 2 via ORAL
  Filled 2020-11-16: qty 2

## 2020-11-16 MED ORDER — ACETAMINOPHEN 650 MG RE SUPP: 650.0000 mg | Freq: Four times a day (QID) | RECTAL | Status: DC | PRN

## 2020-11-16 NOTE — Plan of Care (Signed)
  Problem: Education: Goal: Knowledge of General Education information will improve Description Including pain rating scale, medication(s)/side effects and non-pharmacologic comfort measures Outcome: Progressing   Problem: Elimination: Goal: Will not experience complications related to urinary retention Outcome: Progressing   Problem: Safety: Goal: Ability to remain free from injury will improve Outcome: Progressing   

## 2020-11-16 NOTE — H&P (Addendum)
History and Physical    Danielle Fisher IOE:[id number] DOB: [birth year] DOA: 11/15/2020  PCP: Agustina Caroli, MD  Patient coming from: home  I have personally briefly reviewed patient's old medical records in [facility]  Chief Complaint: aki, abnormal lab  HPI: Danielle Fisher is Danielle Fisher 65 y.o. female with medical history significant of CAD s/p stent, GERD, HTN, T2DM and multiple other medical problems presenting with abnormal labs.  She notes she had blood work last week with the [place] and cardiology.  She was notified Friday by Cardiology that her creatinine was high.  [facility] of [facility] and told her to go to an urgent care (she was in [facility] and decided to come to the ED on her return).  She notes mild lightheadedness and low back pain.  Denies dysuria, frequency.  Denies change in urination pattern.  Notes maybe chills, subjective fever.  No CP, no SOB, no abd pain, nausea, vomiting.  She notes HCTZ is Hardeep Reetz new med over past month (initially planned to start at 12.5, but this does wasn't available).  Denies NSAIDs or other OTC meds.    ED Course: Admit to hospitalist for IVF  Review of Systems: As per HPI otherwise all other systems reviewed and are negative.   Past Medical History:  Diagnosis Date   Arthritis    "legs, arms" (04/20/2015)   CAD (coronary artery disease)    Angela Vazguez. NSTEMI -> LHC 04/21/15: s/p DES to distal RCA; residual moderate ostial rPDA but vessel is overall small; EF 55-65%.   GERD (gastroesophageal reflux disease)    Hyperlipidemia    Hypertension    Kidney stones    Obesity    Thyromegaly 12/21/2012   See CT chest 8/13 /14     Type II diabetes mellitus (HCC)    Vitamin D deficiency 11/06/2016    Past Surgical History:  Procedure Laterality Date   CARDIAC CATHETERIZATION N/Zarahi Fuerst 04/21/2015   Procedure: Left Heart Cath and Coronary Angiography;  Surgeon: Iran Ouch, MD;  [facility]: [facility];  Service:  Cardiovascular;  Laterality: N/Haakon Titsworth;   CARDIAC CATHETERIZATION N/Shawne Bulow 04/21/2015   Procedure: Coronary Stent Intervention;  Surgeon: Iran Ouch, MD;  [facility]: [facility];  Service: Cardiovascular;  Laterality: N/Verdie Barrows;   CESAREAN SECTION  1980   DILATION AND CURETTAGE OF UTERUS  1980's X 1    Social History  reports that she has never smoked. She has never used smokeless tobacco. She reports that she does not drink alcohol and does not use drugs.  No Known Allergies  Family History  Problem Relation Age of Onset   Heart disease Mother    Stroke Mother    Breast cancer Sister    Diabetes Sister    Stroke Father    Diabetes Father    Diabetes Sister    Prior to Admission medications   Medication Sig Start Date End Date Taking? Authorizing Provider  amLODipine (NORVASC) 10 MG tablet Take 10 mg by mouth daily.    [provider]  aspirin EC 81 MG tablet Take 81 mg by mouth daily.    [provider]  carvedilol (COREG) 25 MG tablet Take 1 tablet (25 mg total) by mouth 2 (two) times daily. 04/24/18   Nahser, [name], MD  clopidogrel (PLAVIX) 75 MG tablet Take 1 tablet (75 mg total) by mouth daily. 04/24/18   Nahser, [name], MD  glucose blood [facility]  ULTRA) test strip 1 each by Other route as needed. 11/01/17   [provider]  hydrochlorothiazide (HYDRODIURIL) 25 MG tablet Take 25 mg by mouth daily.    [provider]  insulin glargine (LANTUS) 100 UNIT/ML injection Inject 36 Units into the skin daily.    [provider]  losartan (COZAAR) 100 MG tablet Take 1 tablet (100 mg total) by mouth daily. 04/29/15   Dunn, Tacey Ruiz, PA-C  nitroGLYCERIN (NITROSTAT) 0.4 MG SL tablet Place 1 tablet (0.4 mg total) under the tongue every 5 (five) minutes as needed for chest pain (up to 3 doses). 04/22/15   Dunn, Tacey Ruiz, PA-C  rosuvastatin (CRESTOR) 40 MG tablet Take 1 tablet (40 mg total) by mouth daily. 07/12/20   Nahser, [name], MD  Semaglutide  0.25 or 0.5 MG/DOSE SOPN Inject 0.25 mg into the skin once Tykia Mellone week. 12/21/16   [provider]    Physical Exam: Vitals:   11/15/20 1952 11/15/20 2009 11/15/20 2306 11/15/20 2358  BP:  129/67 131/68 (!) 147/69  Pulse:  73 94 90  Resp:  20 18 18   Temp:  99.1 F (37.3 C) 99.1 F (37.3 C) 100.1 F (37.8 C)  TempSrc:  Oral Oral Oral  SpO2:  98% 98% 99%  Weight: 89.4 kg     Height: 5' 6.5" (1.689 m)       Constitutional: NAD, calm, comfortable Vitals:   11/15/20 1952 11/15/20 2009 11/15/20 2306 11/15/20 2358  BP:  129/67 131/68 (!) 147/69  Pulse:  73 94 90  Resp:  20 18 18   Temp:  99.1 F (37.3 C) 99.1 F (37.3 C) 100.1 F (37.8 C)  TempSrc:  Oral Oral Oral  SpO2:  98% 98% 99%  Weight: 89.4 kg     Height: 5' 6.5" (1.689 m)      Eyes: PERRL, lids and conjunctivae normal ENMT: Mucous membranes are moist. Posterior pharynx clear of any exudate or lesions.Normal dentition.  Neck: normal, supple, no masses, no thyromegaly Respiratory: unlabored Cardiovascular: RRR Abdomen: no tenderness, no masses palpated. No hepatosplenomegaly. Bowel sounds positive.  Musculoskeletal: no clubbing / cyanosis. No joint deformity upper and lower extremities. Good ROM, no contractures. Normal muscle tone.  Skin: no rashes, lesions, ulcers. No induration Neurologic: CN 2-12 grossly intact. Sensation intact.  Moving all extremities.  Psychiatric: Normal judgment and insight. Alert and oriented x 3. Normal mood.   Labs on Admission: I have personally reviewed following labs and imaging studies  CBC: Recent Labs  Lab 11/15/20 2010 11/16/20 0226  WBC 9.9 9.6  NEUTROABS 7.3  --   HGB 12.5 10.6*  HCT 37.8 32.8*  MCV 83.8 84.5  PLT 325 272    Basic Metabolic Panel: Recent Labs  Lab 11/11/20 0942 11/15/20 2040  NA 140 139  K 3.5 2.8*  CL 99 103  CO2 23 26  GLUCOSE 93 98  BUN 47* 50*  CREATININE 3.17* 3.75*  CALCIUM 11.7* 10.1    GFR: Estimated Creatinine Clearance: 16.8  mL/min (Houa Ackert) (by C-G formula based on SCr of 3.75 mg/dL (H)).  Liver Function Tests: Recent Labs  Lab 11/11/20 0942 11/15/20 2040  AST  --  37  ALT 19 37  ALKPHOS  --  153*  BILITOT  --  0.4  PROT  --  8.0  ALBUMIN  --  3.6    Urine analysis:    Component Value Date/Time   COLORURINE YELLOW 11/15/2020 2000   APPEARANCEUR HAZY (Jailyn Langhorst) 11/15/2020 2000  LABSPEC 1.020 11/15/2020 2000   PHURINE 5.5 11/15/2020 2000   GLUCOSEU NEGATIVE 11/15/2020 2000   HGBUR MODERATE (Prescious Hurless) 11/15/2020 2000   BILIRUBINUR NEGATIVE 11/15/2020 2000   KETONESUR NEGATIVE 11/15/2020 2000   PROTEINUR 100 (Roshaun Pound) 11/15/2020 2000   UROBILINOGEN 0.2 07/19/2011 2156   NITRITE NEGATIVE 11/15/2020 2000   LEUKOCYTESUR NEGATIVE 11/15/2020 2000    Radiological Exams on Admission: DG CHEST PORT 1 VIEW  Result Date: 11/16/2020 CLINICAL DATA:  COVID EXAM: PORTABLE CHEST 1 VIEW COMPARISON:  Radiograph 04/20/2015, CT 12/18/2012 FINDINGS: Low lung volumes. Streaky and ill-defined patchy opacities are present in the mid to lower lungs which may reflect further volume loss or developing airspace opacity in the setting of COVID 19. No pneumothorax or visible effusion. Cardiomediastinal contours are unremarkable for the portable technique. No acute osseous or chest wall abnormality. IMPRESSION: Low lung volumes. Streaky and patchy opacities in the lung bases may reflect atelectasis or developing airspace disease in the setting of COVID-19. Electronically Signed   By: Kreg Shropshire M.D.   On: 11/16/2020 01:39   CT Renal Stone Study  Result Date: 11/15/2020 CLINICAL DATA:  Flank pain EXAM: CT ABDOMEN AND PELVIS WITHOUT CONTRAST TECHNIQUE: Multidetector CT imaging of the abdomen and pelvis was performed following the standard protocol without IV contrast. COMPARISON:  None. FINDINGS: Lower chest: No acute abnormality. Hepatobiliary: No focal liver abnormality is seen. No gallstones, gallbladder wall thickening, or biliary dilatation.  Pancreas: Unremarkable. No pancreatic ductal dilatation or surrounding inflammatory changes. Spleen: Normal in size without focal abnormality. Adrenals/Urinary Tract: Adrenal glands are within normal limits. Kidneys are well visualized bilaterally. Bilateral renal calculi are seen right slightly greater than left the largest of which measures 4-5 mm in greatest dimension. No obstructive changes are seen. The bladder is decompressed. Hyperdense cyst is noted posteriorly in the left kidney measuring 3.2 mm in greatest dimension. This likely represents hemorrhagic cyst but is incompletely evaluated on this exam. Stomach/Bowel: No obstructive or inflammatory changes are noted within the colon. The appendix is within normal limits. Small bowel and stomach are unremarkable. Vascular/Lymphatic: Aortic atherosclerosis. No enlarged abdominal or pelvic lymph nodes. Reproductive: Uterus and bilateral adnexa are unremarkable. Other: No abdominal wall hernia or abnormality. No abdominopelvic ascites. Musculoskeletal: No acute or significant osseous findings. IMPRESSION: Bilateral renal calculi without obstructive change. Hyperdense cyst in the left kidney posteriorly as described. It demonstrates some focal calcifications but is incompletely evaluated on this exam. Nonemergent renal ultrasound would be helpful for further evaluation. No other focal abnormality is noted. Electronically Signed   By: Alcide Clever M.D.   On: 11/15/2020 23:23    EKG: Independently reviewed. none  Assessment/Plan Active Problems:   ARF (acute renal failure) (HCC)   AKI (acute kidney injury) (HCC)   Acute Kidney Injury 2/2 Acute Tubular Necrosis Baseline creatinine 1.12, 3.75 at presentation Suspect ATN 2/2 hypotension/hypovolemia related to new BP med, HCTZ started about 1 month ago UA shows granular casts c/w ATN Hold HCTZ and losartan CT stone study without obstructive changes Continue IVF Strict I/O, daily  weights  Hypokalemia Likely 2/2 HCTZ Replace cautiously with AKI  COVID 19 Virus Infection CXR with streaky and patch opacities in lung bases (atelectasis vs developing airspace disease) Mildly symptomatic (chills and subjective fever) With AKI, hold off on paxlovid - will start remdesivir.   Abnormal UA  Pyuria No clear UTI symptoms, did note some low back pain, monitor for signs of infection With bacteria and protein 100 mg/dl Follow culture - repeat  UA outpatient   CAD s/p Stent  Continue aspirin/plavix and crestor Continue coreg  T2DM Continue basal insulin SSI Follow  Hold ozempic  Hypertension Holding losartan and HCTZ Continue coreg and amlodipine  Hyperdense cyst in left kidney Needs nonemergent renal US for further evaluation  DVT prophylaxis: heparin Code Status:   full  Family Communication:  None at bedside  Disposition Plan:   Patient is from:  home  Anticipated DC to:  home  Anticipated DC date:  7/12  Anticipated DC barriers: Pending improvement in renal function  Consults called:  none Admission status:  obs   Severity of Illness: The appropriate patient status for this patient is OBSERVATION. Observation status is judged to be reasonable and necessary in order to provide the required intensity of service to ensure the patient's safety. The patient's presenting symptoms, physical exam findings, and initial radiographic and laboratory data in the context of their medical condition is felt to place them at decreased risk for further clinical deterioration. Furthermore, it is anticipated that the patient will be medically stable for discharge from the hospital within 2 midnights of admission. The following factors support the patient status of observation.   " The patient's presenting symptoms include lightheadedness, chills. " The initial radiographic and laboratory data are aki.    Lacretia Nicksaldwell Powell MD [facility]  How to contact the [facility]  Attending or Consulting provider 7A - 7P or covering provider during after hours 7P -7A, for this patient?   Check the care team in [facility] and look for Keyana Guevara) attending/consulting [facility] provider listed and b) the [facility] team listed Log into [email or web address] and use [place]'s universal password to access. If you do not have the password, please contact the hospital operator. Locate the [facility] provider you are looking for under [facility] and page to Ailen Strauch number that you can be directly reached. If you still have difficulty reaching the provider, please page the [facility] (Director on Call) for the Hospitalists listed on amion for assistance.  11/16/2020, 3:04 AM

## 2020-11-16 NOTE — Care Plan (Signed)
This 65 yrs old female with PMH significant for CAD s/p stents, GERD, HTN, T2DM and other comorbid conditions presented with abnormal labs.  Patient had lab work with [facility] and cardiology last week. She was out of town in [facility] and she has received a call over the weekend that her creatinine is high and was advised to go to urgent care.  Patient came back in the ED and was admitted. She reports mild lightheadedness, low back pain. Patient is admitted for acute kidney injury and was started on IV hydration,  serum creatinine is improving.  CT abdomen negative for renal stones.  Patient also found to be COVID-positive and started on remdesivir.

## 2020-11-16 NOTE — Plan of Care (Signed)
  Problem: Education: Goal: Knowledge of General Education information will improve Description: Including pain rating scale, medication(s)/side effects and non-pharmacologic comfort measures Outcome: Progressing   Problem: Elimination: Goal: Will not experience complications related to urinary retention Outcome: Progressing   Problem: Safety: Goal: Ability to remain free from injury will improve Outcome: Progressing   Problem: Education: Goal: Knowledge of risk factors and measures for prevention of condition will improve Outcome: Progressing   Problem: Respiratory: Goal: Will maintain a patent airway Outcome: Progressing

## 2020-11-17 ENCOUNTER — Inpatient Hospital Stay (HOSPITAL_COMMUNITY): Payer: No Typology Code available for payment source

## 2020-11-17 DIAGNOSIS — J1282 Pneumonia due to coronavirus disease 2019: Secondary | ICD-10-CM | POA: Diagnosis present

## 2020-11-17 DIAGNOSIS — N179 Acute kidney failure, unspecified: Secondary | ICD-10-CM | POA: Diagnosis not present

## 2020-11-17 DIAGNOSIS — U071 COVID-19: Secondary | ICD-10-CM | POA: Diagnosis present

## 2020-11-17 LAB — BASIC METABOLIC PANEL
Anion gap: 10 (ref 5–15)
BUN: 35 mg/dL — ABNORMAL HIGH (ref 8–23)
CO2: 21 mmol/L — ABNORMAL LOW (ref 22–32)
Calcium: 10.1 mg/dL (ref 8.9–10.3)
Chloride: 108 mmol/L (ref 98–111)
Creatinine, Ser: 2.97 mg/dL — ABNORMAL HIGH (ref 0.44–1.00)
GFR, Estimated: 17 mL/min — ABNORMAL LOW (ref >60)
Glucose, Bld: 68 mg/dL — ABNORMAL LOW (ref 70–99)
Potassium: 3.2 mmol/L — ABNORMAL LOW (ref 3.5–5.1)
Sodium: 139 mmol/L (ref 135–145)

## 2020-11-17 LAB — URINE CULTURE: Culture: NO GROWTH

## 2020-11-17 LAB — GLUCOSE, CAPILLARY
Glucose-Capillary: 107 mg/dL — ABNORMAL HIGH (ref 70–99)
Glucose-Capillary: 197 mg/dL — ABNORMAL HIGH (ref 70–99)
Glucose-Capillary: 226 mg/dL — ABNORMAL HIGH (ref 70–99)
Glucose-Capillary: 67 mg/dL — ABNORMAL LOW (ref 70–99)
Glucose-Capillary: 77 mg/dL (ref 70–99)
Glucose-Capillary: 94 mg/dL (ref 70–99)

## 2020-11-17 LAB — CBC
HCT: 35.8 % — ABNORMAL LOW (ref 36.0–46.0)
Hemoglobin: 11.4 g/dL — ABNORMAL LOW (ref 12.0–15.0)
MCH: 27.3 pg (ref 26.0–34.0)
MCHC: 31.8 g/dL (ref 30.0–36.0)
MCV: 85.9 fL (ref 80.0–100.0)
Platelets: 254 10*3/uL (ref 150–400)
RBC: 4.17 MIL/uL (ref 3.87–5.11)
RDW: 15 % (ref 11.5–15.5)
WBC: 8.6 10*3/uL (ref 4.0–10.5)
nRBC: 0 % (ref 0.0–0.2)

## 2020-11-17 LAB — PHOSPHORUS: Phosphorus: 3.3 mg/dL (ref 2.5–4.6)

## 2020-11-17 LAB — MAGNESIUM: Magnesium: 1.7 mg/dL (ref 1.7–2.4)

## 2020-11-17 MED ORDER — DEXAMETHASONE 4 MG PO TABS
6.0000 mg | ORAL_TABLET | Freq: Every day | ORAL | Status: DC
  Administered 2020-11-17 – 2020-11-19 (×3): 6 mg via ORAL
  Filled 2020-11-17 (×4): qty 1

## 2020-11-17 MED ORDER — POTASSIUM CHLORIDE CRYS ER 20 MEQ PO TBCR
40.0000 meq | EXTENDED_RELEASE_TABLET | Freq: Once | ORAL | Status: AC
  Administered 2020-11-17: 40 meq via ORAL
  Filled 2020-11-17: qty 2

## 2020-11-17 MED ORDER — INSULIN GLARGINE 100 UNIT/ML ~~LOC~~ SOLN
10.0000 [IU] | Freq: Every day | SUBCUTANEOUS | Status: DC
  Administered 2020-11-18 – 2020-11-19 (×2): 10 [IU] via SUBCUTANEOUS
  Filled 2020-11-17 (×2): qty 0.1

## 2020-11-17 MED ORDER — PHENOL 1.4 % MT LIQD
1.0000 | OROMUCOSAL | Status: DC | PRN
  Administered 2020-11-17 – 2020-11-18 (×2): 1 via OROMUCOSAL
  Filled 2020-11-17: qty 177

## 2020-11-17 NOTE — Progress Notes (Addendum)
PROGRESS NOTE    Danielle Fisher  QIO:[id number] DOB: [birth year] DOA: 11/15/2020 PCP: Agustina Caroli, MD   Brief Narrative:  This 65 yrs old female with PMH significant for CAD s/p stents, GERD, HTN, T2DM and other comorbid conditions presented with abnormal labs.  Patient had lab work with [facility] and cardiology last week. She was out of town in [facility] and she has received a call over the weekend that her creatinine is high and was advised to go to urgent care.  Patient came back in the ED and was admitted. She reports mild lightheadedness, low back pain. Patient is admitted for acute kidney injury and was started on IV hydration,  serum creatinine is improving.  CT abdomen negative for renal stones.  Patient also found to be COVID-positive and started on remdesivir.  Assessment & Plan:   Active Problems:   ARF (acute renal failure) (HCC)   AKI (acute kidney injury) (HCC)  Acute Kidney Injury 2/2 Acute Tubular Necrosis Baseline creatinine 1.12, 3.75 at presentation and improved to 2.97 today. Suspect ATN 2/2 hypotension/hypovolemia related to new BP med, HCTZ started about 1 month ago. UA shows granular casts c/w ATN CT stone study without obstructive changes.  Continue to hold HCTZ and losartan.  Continue IV hydration.  For some reason, no output has been charted since yesterday.  Repeat labs in the morning.  Hypokalemia: Likely 2/2 HCTZ, low again, will replace.   COVID 19 Virus Infection CXR with streaky and patch opacities in lung bases (atelectasis vs developing airspace disease) Mildly symptomatic (chills and subjective fever), with fever of 100.9 again this morning.  However she has no shortness of breath.  She is not hypoxic.  Continue remdesivir and add dexamethasone.   Abnormal UA  Pyuria No clear UTI symptoms, no need to treat.   CAD s/p Stent Continue aspirin/plavix and crestor Continue coreg   T2DM: Had hypoglycemia this morning.  reduced Lantus to 10 units.  Continue SSI.  Monitor closely. Hold ozempic   Hypertension: Blood pressure controlled. Holding losartan and HCTZ Continue coreg and amlodipine   Hyperdense cyst in left kidney Needs nonemergent renal US for further evaluation as outpatient.  DVT prophylaxis: heparin injection 5,000 Units Start: 11/16/20 0600   Code Status: Full Code  Family Communication:  None present at bedside.  Plan of care discussed with patient in length and he verbalized understanding and agreed with it.  Status is: Inpatient  Remains inpatient appropriate because:Inpatient level of care appropriate due to severity of illness  Dispo: The patient is from: Home              Anticipated d/c is to: Home              Patient currently is not medically stable to d/c.   Difficult to place patient No        Estimated body mass index is 31.55 kg/m as calculated from the following:   Height as of this encounter: 5' 6.5" (1.689 m).   Weight as of this encounter: 90 kg.      Nutritional status:               Consultants:  None  Procedures:  Plan  Antimicrobials:  Anti-infectives (From admission, onward)    Start     Dose/Rate Route Frequency Ordered Stop   11/17/20 1000  remdesivir 100 mg in sodium chloride 0.9 % 100 mL IVPB       See Hyperspace for full Linked Orders  Report.   100 mg 200 mL/hr over 30 Minutes Intravenous Daily 11/16/20 0334 11/21/20 0959   11/16/20 0500  remdesivir 100 mg in sodium chloride 0.9 % 100 mL IVPB       See Hyperspace for full Linked Orders Report.   100 mg 200 mL/hr over 30 Minutes Intravenous  Once 11/16/20 0334 11/16/20 0947   11/16/20 0430  remdesivir 100 mg in sodium chloride 0.9 % 100 mL IVPB       See Hyperspace for full Linked Orders Report.   100 mg 200 mL/hr over 30 Minutes Intravenous  Once 11/16/20 0334 11/16/20 0947   11/15/20 2145  cefTRIAXone (ROCEPHIN) 1 g in sodium chloride 0.9 % 100 mL IVPB  Status:  Discontinued        1 g 200 mL/hr  over 30 Minutes Intravenous Every 24 hours 11/15/20 2136 11/16/20 1157          Subjective: Seen and examined.  She complains of chills and fever early morning and feels generalized weakness but no shortness of breath.  Objective: Vitals:   11/16/20 2001 11/17/20 0510 11/17/20 0820 11/17/20 1120  BP: (!) 149/77 (!) 158/77 (!) 124/59 124/67  Pulse: 77 93 80 81  Resp: Temp: (!) 100.8 F (38.2 C) (!) 100.8 F (38.2 C) 99.8 F (37.7 C) 98.8 F (37.1 C)  TempSrc: Oral Oral Oral Oral  SpO2: 100% 100% 100% 100%  Weight:      Height:        Intake/Output Summary (Last 24 hours) at 11/17/2020 1309 Last data filed at 11/17/2020 0614 Gross per 24 hour  Intake 3115.52 ml  Output --  Net 3115.52 ml   Filed Weights   11/15/20 1952 11/16/20 0426  Weight: 89.4 kg 90 kg    Examination:  General exam: Appears calm and comfortable  Respiratory system: Clear to auscultation. Respiratory effort normal. Cardiovascular system: S1 & S2 heard, RRR. No JVD, murmurs, rubs, gallops or clicks. No pedal edema. Gastrointestinal system: Abdomen is nondistended, soft and nontender. No organomegaly or masses felt. Normal bowel sounds heard. Central nervous system: Alert and oriented. No focal neurological deficits. Extremities: Symmetric 5 x 5 power. Skin: No rashes, lesions or ulcers.  Psychiatry: Judgement and insight appear normal. Mood & affect appropriate.   Data Reviewed: I have personally reviewed following labs and imaging studies  CBC: Recent Labs  Lab 11/15/20 2010 11/16/20 0226 11/17/20 0346  WBC 9.9 9.6 8.6  NEUTROABS 7.3  --   --   HGB 12.5 10.6* 11.4*  HCT 37.8 32.8* 35.8*  MCV 83.8 84.5 85.9  PLT 325 272 254   Basic Metabolic Panel: Recent Labs  Lab 11/11/20 0942 11/15/20 2040 11/16/20 0226 11/17/20 0346  NA 140 139 142 139  K 3.5 2.8* 2.8* 3.2*  CL 99 103 109 108  CO2 21*  GLUCOSE 93 98 101* 68*  BUN 47* 50* 49* 35*  CREATININE 3.17*  3.75* 3.36*  3.32* 2.97*  CALCIUM 11.7* 10.1 10.1 10.1  MG  --   --  2.4 1.7  PHOS  --   --  3.5 3.3   GFR: Estimated Creatinine Clearance: 21.3 mL/min (A) (by C-G formula based on SCr of 2.97 mg/dL (H)). Liver Function Tests: Recent Labs  Lab 11/11/20 0942 11/15/20 2040 11/16/20 0226  AST  --  37 35  ALT 19 37 34  ALKPHOS  --  153* 124  BILITOT  --  0.4 0.5  PROT  --  8.0 7.1  ALBUMIN  --  3.6 3.2*   No results for input(s): LIPASE, AMYLASE in the last 168 hours. No results for input(s): AMMONIA in the last 168 hours. Coagulation Profile: No results for input(s): INR, PROTIME in the last 168 hours. Cardiac Enzymes: Recent Labs  Lab 11/16/20 0226  CKTOTAL 156   BNP (last 3 results) No results for input(s): PROBNP in the last 8760 hours. HbA1C: Recent Labs    11/16/20 0226  HGBA1C 6.2*   CBG: Recent Labs  Lab 11/16/20 1957 11/17/20 0031 11/17/20 0507 11/17/20 0820 11/17/20 1113  GLUCAP 97 77 67* 107* 94   Lipid Profile: No results for input(s): CHOL, HDL, LDLCALC, TRIG, CHOLHDL, LDLDIRECT in the last 72 hours. Thyroid Function Tests: No results for input(s): TSH, T4TOTAL, FREET4, T3FREE, THYROIDAB in the last 72 hours. Anemia Panel: Recent Labs    11/16/20 0226  FERRITIN 199   Sepsis Labs: No results for input(s): PROCALCITON, LATICACIDVEN in the last 168 hours.  Recent Results (from the past 240 hour(s))  Resp Panel by RT-PCR (Flu A&B, Covid) Nasopharyngeal Swab     Status: Abnormal   Collection Time: 11/15/20  7:36 PM   Specimen: Nasopharyngeal Swab; Nasopharyngeal(NP) swabs in vial transport medium  Result Value Ref Range Status   SARS Coronavirus 2 by RT PCR POSITIVE (A) NEGATIVE Final    Comment: RESULT CALLED TO, READ BACK BY AND VERIFIED WITH: Riki AltesGIBSON, K, RN AT 2100 ON [id number] BY BOWLBY, J (NOTE) SARS-CoV-2 target nucleic acids are DETECTED.  The SARS-CoV-2 RNA is generally detectable in upper respiratory specimens during the acute phase  of infection. Positive results are indicative of the presence of the identified virus, but do not rule out bacterial infection or co-infection with other pathogens not detected by the test. Clinical correlation with patient history and other diagnostic information is necessary to determine patient infection status. The expected result is Negative.  Fact Sheet for Patients: BloggerCourse.comhttps://www.fda.gov/media/152166/download  Fact Sheet for Healthcare Providers: SeriousBroker.ithttps://www.fda.gov/media/152162/download  This test is not yet approved or cleared by the [place] FDA and  has been authorized for detection and/or diagnosis of SARS-CoV-2 by FDA under an Emergency Use Authorization (EUA).  This EUA will remain in effect (meaning thi s test can be used) for the duration of  the COVID-19 declaration under Section 564(b)(1) of the Act, 21 U.S.C. section 360bbb-3(b)(1), unless the authorization is terminated or revoked sooner.     Influenza A by PCR NEGATIVE NEGATIVE Final   Influenza B by PCR NEGATIVE NEGATIVE Final    Comment: (NOTE) The Xpert Xpress SARS-CoV-2/FLU/RSV plus assay is intended as an aid in the diagnosis of influenza from Nasopharyngeal swab specimens and should not be used as a sole basis for treatment. Nasal washings and aspirates are unacceptable for Xpert Xpress SARS-CoV-2/FLU/RSV testing.  Fact Sheet for Patients: BloggerCourse.comhttps://www.fda.gov/media/152166/download  Fact Sheet for Healthcare Providers: SeriousBroker.ithttps://www.fda.gov/media/152162/download  This test is not yet approved or cleared by the [place] FDA and has been authorized for detection and/or diagnosis of SARS-CoV-2 by FDA under an Emergency Use Authorization (EUA). This EUA will remain in effect (meaning this test can be used) for the duration of the COVID-19 declaration under Section 564(b)(1) of the Act, 21 U.S.C. section 360bbb-3(b)(1), unless the authorization is terminated or revoked.  Performed at [facility]  [place], [address] [place], [place] [id number]   Culture, Urine     Status: None   Collection Time: 11/16/20  6:10 AM  Specimen: Urine, Clean Catch  Result Value Ref Range Status   Specimen Description   Final    URINE, CLEAN CATCH Performed at [facility], [address], [place], [place] [address]    Special Requests   Final    NONE Performed at [facility], [address], [place], [place] [address]    Culture   Final    NO GROWTH Performed at [facility], [address] [place], [place] [address]    Report Status 11/17/2020 FINAL  Final      Radiology Studies: DG CHEST PORT 1 VIEW  Result Date: 11/16/2020 CLINICAL DATA:  COVID EXAM: PORTABLE CHEST 1 VIEW COMPARISON:  Radiograph 04/20/2015, CT 12/18/2012 FINDINGS: Low lung volumes. Streaky and ill-defined patchy opacities are present in the mid to lower lungs which may reflect further volume loss or developing airspace opacity in the setting of COVID 19. No pneumothorax or visible effusion. Cardiomediastinal contours are unremarkable for the portable technique. No acute osseous or chest wall abnormality. IMPRESSION: Low lung volumes. Streaky and patchy opacities in the lung bases may reflect atelectasis or developing airspace disease in the setting of COVID-19. Electronically Signed   By: Kreg Shropshire M.D.   On: 11/16/2020 01:39   CT Renal Stone Study  Result Date: 11/15/2020 CLINICAL DATA:  Flank pain EXAM: CT ABDOMEN AND PELVIS WITHOUT CONTRAST TECHNIQUE: Multidetector CT imaging of the abdomen and pelvis was performed following the standard protocol without IV contrast. COMPARISON:  None. FINDINGS: Lower chest: No acute abnormality. Hepatobiliary: No focal liver abnormality is seen. No gallstones, gallbladder wall thickening, or biliary dilatation. Pancreas: Unremarkable. No pancreatic ductal dilatation or surrounding inflammatory changes. Spleen: Normal in size without  focal abnormality. Adrenals/Urinary Tract: Adrenal glands are within normal limits. Kidneys are well visualized bilaterally. Bilateral renal calculi are seen right slightly greater than left the largest of which measures 4-5 mm in greatest dimension. No obstructive changes are seen. The bladder is decompressed. Hyperdense cyst is noted posteriorly in the left kidney measuring 3.2 mm in greatest dimension. This likely represents hemorrhagic cyst but is incompletely evaluated on this exam. Stomach/Bowel: No obstructive or inflammatory changes are noted within the colon. The appendix is within normal limits. Small bowel and stomach are unremarkable. Vascular/Lymphatic: Aortic atherosclerosis. No enlarged abdominal or pelvic lymph nodes. Reproductive: Uterus and bilateral adnexa are unremarkable. Other: No abdominal wall hernia or abnormality. No abdominopelvic ascites. Musculoskeletal: No acute or significant osseous findings. IMPRESSION: Bilateral renal calculi without obstructive change. Hyperdense cyst in the left kidney posteriorly as described. It demonstrates some focal calcifications but is incompletely evaluated on this exam. Nonemergent renal ultrasound would be helpful for further evaluation. No other focal abnormality is noted. Electronically Signed   By: Alcide Clever M.D.   On: 11/15/2020 23:23    Scheduled Meds:  amLODipine  10 mg Oral Daily   aspirin EC  81 mg Oral Daily   carvedilol  25 mg Oral BID   clopidogrel  75 mg Oral Daily   dexamethasone  6 mg Oral Daily   heparin  5,000 Units Subcutaneous Q8H   insulin aspart  0-5 Units Subcutaneous QHS   insulin aspart  0-9 Units Subcutaneous TID WC   [START ON 11/18/2020] insulin glargine  10 Units Subcutaneous Daily   Continuous Infusions:  lactated ringers 125 mL/hr at 11/17/20 7062   remdesivir 100 mg in NS 100 mL 100 mg (11/17/20 0920)     LOS: 1 day  Time spent: 32 minutes   Hughie Closs, MD [facility]  11/17/2020, 1:09 PM    How to contact the [facility] Attending or Consulting provider 7A - 7P or covering provider during after hours 7P -7A, for this patient?  Check the care team in [facility] and look for a) attending/consulting [facility] provider listed and b) the [facility] team listed. Page or secure chat 7A-7P. Log into [email or web address] and use [place] universal password to access. If you do not have the password, please contact the hospital operator. Locate the [facility] provider you are looking for under [facility] and page to a number that you can be directly reached. If you still have difficulty reaching the provider, please page the [facility] (Director on Call) for the Hospitalists listed on amion for assistance.

## 2020-11-17 NOTE — Plan of Care (Signed)
  Problem: Education: Goal: Knowledge of General Education information will improve Description: Including pain rating scale, medication(s)/side effects and non-pharmacologic comfort measures Outcome: Progressing   Problem: Activity: Goal: Risk for activity intolerance will decrease Outcome: Progressing   Problem: Nutrition: Goal: Adequate nutrition will be maintained Outcome: Progressing   Problem: Elimination: Goal: Will not experience complications related to bowel motility Outcome: Progressing Goal: Will not experience complications related to urinary retention Outcome: Progressing   Problem: Pain Managment: Goal: General experience of comfort will improve Outcome: Progressing   Problem: Safety: Goal: Ability to remain free from injury will improve Outcome: Progressing   Problem: Skin Integrity: Goal: Risk for impaired skin integrity will decrease Outcome: Progressing   Problem: Education: Goal: Knowledge of risk factors and measures for prevention of condition will improve Outcome: Progressing   Problem: Respiratory: Goal: Will maintain a patent airway Outcome: Progressing

## 2020-11-18 DIAGNOSIS — N179 Acute kidney failure, unspecified: Secondary | ICD-10-CM | POA: Diagnosis not present

## 2020-11-18 LAB — CBC WITH DIFFERENTIAL/PLATELET
Abs Immature Granulocytes: 0.02 10*3/uL (ref 0.00–0.07)
Basophils Absolute: 0 10*3/uL (ref 0.0–0.1)
Basophils Relative: 0 %
Eosinophils Absolute: 0 10*3/uL (ref 0.0–0.5)
Eosinophils Relative: 0 %
HCT: 33.1 % — ABNORMAL LOW (ref 36.0–46.0)
Hemoglobin: 10.5 g/dL — ABNORMAL LOW (ref 12.0–15.0)
Immature Granulocytes: 0 %
Lymphocytes Relative: 15 %
Lymphs Abs: 0.8 10*3/uL (ref 0.7–4.0)
MCH: 27 pg (ref 26.0–34.0)
MCHC: 31.7 g/dL (ref 30.0–36.0)
MCV: 85.1 fL (ref 80.0–100.0)
Monocytes Absolute: 0.6 10*3/uL (ref 0.1–1.0)
Monocytes Relative: 11 %
Neutro Abs: 3.9 10*3/uL (ref 1.7–7.7)
Neutrophils Relative %: 74 %
Platelets: 257 10*3/uL (ref 150–400)
RBC: 3.89 MIL/uL (ref 3.87–5.11)
RDW: 15 % (ref 11.5–15.5)
WBC: 5.3 10*3/uL (ref 4.0–10.5)
nRBC: 0 % (ref 0.0–0.2)

## 2020-11-18 LAB — GLUCOSE, CAPILLARY
Glucose-Capillary: 115 mg/dL — ABNORMAL HIGH (ref 70–99)
Glucose-Capillary: 156 mg/dL — ABNORMAL HIGH (ref 70–99)
Glucose-Capillary: 246 mg/dL — ABNORMAL HIGH (ref 70–99)
Glucose-Capillary: 293 mg/dL — ABNORMAL HIGH (ref 70–99)

## 2020-11-18 LAB — BASIC METABOLIC PANEL
Anion gap: 7 (ref 5–15)
BUN: 36 mg/dL — ABNORMAL HIGH (ref 8–23)
CO2: 22 mmol/L (ref 22–32)
Calcium: 9.7 mg/dL (ref 8.9–10.3)
Chloride: 108 mmol/L (ref 98–111)
Creatinine, Ser: 2.53 mg/dL — ABNORMAL HIGH (ref 0.44–1.00)
GFR, Estimated: 20 mL/min — ABNORMAL LOW (ref >60)
Glucose, Bld: 185 mg/dL — ABNORMAL HIGH (ref 70–99)
Potassium: 3.5 mmol/L (ref 3.5–5.1)
Sodium: 137 mmol/L (ref 135–145)

## 2020-11-18 NOTE — Plan of Care (Signed)
  Problem: Education: Goal: Knowledge of General Education information will improve Description: Including pain rating scale, medication(s)/side effects and non-pharmacologic comfort measures Outcome: Progressing   Problem: Activity: Goal: Risk for activity intolerance will decrease Outcome: Progressing   Problem: Nutrition: Goal: Adequate nutrition will be maintained Outcome: Progressing   Problem: Elimination: Goal: Will not experience complications related to bowel motility Outcome: Progressing Goal: Will not experience complications related to urinary retention Outcome: Progressing Patient voiding without difficulty   Problem: Pain Managment: Goal: General experience of comfort will improve Outcome: Progressing   Problem: Safety: Goal: Ability to remain free from injury will improve Outcome: Progressing   Problem: Skin Integrity: Goal: Risk for impaired skin integrity will decrease Outcome: Progressing   Problem: Education: Goal: Knowledge of risk factors and measures for prevention of condition will improve Outcome: Progressing   Problem: Respiratory: Goal: Will maintain a patent airway Outcome: Progressing  Patient remains on RA in no distress

## 2020-11-18 NOTE — Progress Notes (Signed)
PROGRESS NOTE    Danielle Fisher  LKG:[id number] DOB: 1955-09-10 DOA: 11/15/2020 PCP: Agustina Caroli, MD   Brief Narrative:  This 65 yrs old female with PMH significant for CAD s/p stents, GERD, HTN, T2DM and other comorbid conditions presented with abnormal labs.  Patient had lab work with [facility] and cardiology last week. She was out of town in [facility] and she has received a call over the weekend that her creatinine is high and was advised to go to urgent care.  Patient came back in the ED and was admitted. She reports mild lightheadedness, low back pain. Patient is admitted for acute kidney injury and was started on IV hydration,  serum creatinine is improving.  CT abdomen negative for renal stones.  Patient also found to be COVID-positive and started on remdesivir.  Assessment & Plan:   Active Problems:   HTN (hypertension)   HLD (hyperlipidemia)   Type 2 diabetes mellitus (HCC)   CAD S/P percutaneous coronary angioplasty   ARF (acute renal failure) (HCC)   AKI (acute kidney injury) (HCC)   Pneumonia due to COVID-19 virus  Acute Kidney Injury 2/2 Acute Tubular Necrosis Baseline creatinine 1.12, 3.75 at presentation and improved to 2.97 today. Suspect ATN 2/2 hypotension/hypovolemia related to new BP med, HCTZ started about 1 month ago. UA shows granular casts c/w ATN CT stone study without obstructive changes.  Continue to hold HCTZ and losartan.  Creatinine has improved only slightly.  Total output charted is only 400 cc, I doubt accuracy.  Continue IV fluids.  Repeat labs in the morning.  Hypokalemia: Resolved.   COVID 19 Virus Infection CXR with streaky and patch opacities in lung bases (atelectasis vs developing airspace disease) Mildly symptomatic (chills and subjective fever), with fever of 100.9 on the morning of 11/17/2020.  She has remained afebrile since then.  However she has no shortness of breath.  She is not hypoxic.  Continue remdesivir and dexamethasone.    Abnormal UA  Pyuria No clear UTI symptoms, no need to treat.   CAD s/p Stent Continue aspirin/plavix and crestor Continue coreg   T2DM: Now blood sugar better controlled, even slightly elevated.  Continue current Lantus of 10 units and continue SSI.   Hypertension: Blood pressure controlled. Holding losartan and HCTZ Continue coreg and amlodipine   Hyperdense cyst in left kidney Needs nonemergent renal US for further evaluation as outpatient.  DVT prophylaxis: heparin injection 5,000 Units Start: 11/16/20 0600   Code Status: Full Code  Family Communication:  None present at bedside.  Plan of care discussed with patient in length and he verbalized understanding and agreed with it.  Status is: Inpatient  Remains inpatient appropriate because:Inpatient level of care appropriate due to severity of illness  Dispo: The patient is from: Home              Anticipated d/c is to: Home              Patient currently is not medically stable to d/c.   Difficult to place patient No        Estimated body mass index is 31.72 kg/m as calculated from the following:   Height as of this encounter: 5' 6.5" (1.689 m).   Weight as of this encounter: 90.5 kg.      Nutritional status:               Consultants:  None  Procedures:  none  Antimicrobials:  Anti-infectives (From admission, onward)  Start     Dose/Rate Route Frequency Ordered Stop   11/17/20 1000  remdesivir 100 mg in sodium chloride 0.9 % 100 mL IVPB       See Hyperspace for full Linked Orders Report.   100 mg 200 mL/hr over 30 Minutes Intravenous Daily 11/16/20 0334 11/21/20 0959   11/16/20 0500  remdesivir 100 mg in sodium chloride 0.9 % 100 mL IVPB       See Hyperspace for full Linked Orders Report.   100 mg 200 mL/hr over 30 Minutes Intravenous  Once 11/16/20 0334 11/16/20 0947   11/16/20 0430  remdesivir 100 mg in sodium chloride 0.9 % 100 mL IVPB       See Hyperspace for full Linked Orders  Report.   100 mg 200 mL/hr over 30 Minutes Intravenous  Once 11/16/20 0334 11/16/20 0947   11/15/20 2145  cefTRIAXone (ROCEPHIN) 1 g in sodium chloride 0.9 % 100 mL IVPB  Status:  Discontinued        1 g 200 mL/hr over 30 Minutes Intravenous Every 24 hours 11/15/20 2136 11/16/20 1157          Subjective: Seen and examined.  She is feeling much better and has no complaints and in fact wants to go home.  Objective: Vitals:   11/17/20 2014 11/18/20 0619 11/18/20 0906 11/18/20 1159  BP: 138/76 (!) 150/78 (!) 151/79 137/71  Pulse: 80 78 72 70  Resp: 18 18  16   Temp: 98 F (36.7 C) 97.6 F (36.4 C)  98.4 F (36.9 C)  TempSrc: Oral Oral  Oral  SpO2: 99% 97%  100%  Weight:  90.5 kg    Height:        Intake/Output Summary (Last 24 hours) at 11/18/2020 1322 Last data filed at 11/18/2020 0553 Gross per 24 hour  Intake 3149.9 ml  Output 400 ml  Net 2749.9 ml    Filed Weights   11/15/20 1952 11/16/20 0426 11/18/20 0619  Weight: 89.4 kg 90 kg 90.5 kg    Examination:  General exam: Appears calm and comfortable  Respiratory system: Clear to auscultation. Respiratory effort normal. Cardiovascular system: S1 & S2 heard, RRR. No JVD, murmurs, rubs, gallops or clicks. No pedal edema. Gastrointestinal system: Abdomen is nondistended, soft and nontender. No organomegaly or masses felt. Normal bowel sounds heard. Central nervous system: Alert and oriented. No focal neurological deficits. Extremities: Symmetric 5 x 5 power. Skin: No rashes, lesions or ulcers.  Psychiatry: Judgement and insight appear normal. Mood & affect appropriate.    Data Reviewed: I have personally reviewed following labs and imaging studies  CBC: Recent Labs  Lab 11/15/20 2010 11/16/20 0226 11/17/20 0346 11/18/20 0328  WBC 9.9 9.6 8.6 5.3  NEUTROABS 7.3  --   --  3.9  HGB 12.5 10.6* 11.4* 10.5*  HCT 37.8 32.8* 35.8* 33.1*  MCV 83.8 84.5 85.9 85.1  PLT 325 272 254 257    Basic Metabolic  Panel: Recent Labs  Lab 11/15/20 2040 11/16/20 0226 11/17/20 0346 11/18/20 0328  NA 139 142 139 137  K 2.8* 2.8* 3.2* 3.5  CL 103 109 108 108  CO2 26 22 21* 22  GLUCOSE 98 101* 68* 185*  BUN 50* 49* 35* 36*  CREATININE 3.75* 3.36*  3.32* 2.97* 2.53*  CALCIUM 10.1 10.1 10.1 9.7  MG  --  2.4 1.7  --   PHOS  --  3.5 3.3  --     GFR: Estimated Creatinine Clearance: 25 mL/min (A) (by  C-G formula based on SCr of 2.53 mg/dL (H)). Liver Function Tests: Recent Labs  Lab 11/15/20 2040 11/16/20 0226  AST 37 35  ALT 37 34  ALKPHOS 153* 124  BILITOT 0.4 0.5  PROT 8.0 7.1  ALBUMIN 3.6 3.2*    No results for input(s): LIPASE, AMYLASE in the last 168 hours. No results for input(s): AMMONIA in the last 168 hours. Coagulation Profile: No results for input(s): INR, PROTIME in the last 168 hours. Cardiac Enzymes: Recent Labs  Lab 11/16/20 0226  CKTOTAL 156    BNP (last 3 results) No results for input(s): PROBNP in the last 8760 hours. HbA1C: Recent Labs    11/16/20 0226  HGBA1C 6.2*    CBG: Recent Labs  Lab 11/17/20 1113 11/17/20 1609 11/17/20 2001 11/18/20 0812 11/18/20 1154  GLUCAP 94 197* 226* 115* 156*    Lipid Profile: No results for input(s): CHOL, HDL, LDLCALC, TRIG, CHOLHDL, LDLDIRECT in the last 72 hours. Thyroid Function Tests: No results for input(s): TSH, T4TOTAL, FREET4, T3FREE, THYROIDAB in the last 72 hours. Anemia Panel: Recent Labs    11/16/20 0226  FERRITIN 199    Sepsis Labs: No results for input(s): PROCALCITON, LATICACIDVEN in the last 168 hours.  Recent Results (from the past 240 hour(s))  Resp Panel by RT-PCR (Flu A&B, Covid) Nasopharyngeal Swab     Status: Abnormal   Collection Time: 11/15/20  7:36 PM   Specimen: Nasopharyngeal Swab; Nasopharyngeal(NP) swabs in vial transport medium  Result Value Ref Range Status   SARS Coronavirus 2 by RT PCR POSITIVE (A) NEGATIVE Final    Comment: RESULT CALLED TO, READ BACK BY AND VERIFIED  WITH: Riki Altes, RN AT 2100 ON [id number] BY BOWLBY, J (NOTE) SARS-CoV-2 target nucleic acids are DETECTED.  The SARS-CoV-2 RNA is generally detectable in upper respiratory specimens during the acute phase of infection. Positive results are indicative of the presence of the identified virus, but do not rule out bacterial infection or co-infection with other pathogens not detected by the test. Clinical correlation with patient history and other diagnostic information is necessary to determine patient infection status. The expected result is Negative.  Fact Sheet for Patients: BloggerCourse.com  Fact Sheet for Healthcare Providers: SeriousBroker.it  This test is not yet approved or cleared by the [place] FDA and  has been authorized for detection and/or diagnosis of SARS-CoV-2 by FDA under an Emergency Use Authorization (EUA).  This EUA will remain in effect (meaning thi s test can be used) for the duration of  the COVID-19 declaration under Section 564(b)(1) of the Act, 21 U.S.C. section 360bbb-3(b)(1), unless the authorization is terminated or revoked sooner.     Influenza A by PCR NEGATIVE NEGATIVE Final   Influenza B by PCR NEGATIVE NEGATIVE Final    Comment: (NOTE) The Xpert Xpress SARS-CoV-2/FLU/RSV plus assay is intended as an aid in the diagnosis of influenza from Nasopharyngeal swab specimens and should not be used as a sole basis for treatment. Nasal washings and aspirates are unacceptable for Xpert Xpress SARS-CoV-2/FLU/RSV testing.  Fact Sheet for Patients: BloggerCourse.com  Fact Sheet for Healthcare Providers: SeriousBroker.it  This test is not yet approved or cleared by the [place] FDA and has been authorized for detection and/or diagnosis of SARS-CoV-2 by FDA under an Emergency Use Authorization (EUA). This EUA will remain in effect (meaning this test  can be used) for the duration of the COVID-19 declaration under Section 564(b)(1) of the Act, 21 U.S.C. section 360bbb-3(b)(1), unless the authorization is  terminated or revoked.  Performed at [facility] [place] [facility], [address] [place], [place] [address]   Culture, Urine     Status: None   Collection Time: 11/16/20  6:10 AM   Specimen: Urine, Clean Catch  Result Value Ref Range Status   Specimen Description   Final    URINE, CLEAN CATCH Performed at [facility], [address] [place], [place] [address]    Special Requests   Final    NONE Performed at [facility], [address] [place], [place] [address]    Culture   Final    NO GROWTH Performed at [facility], [address] [place], [place] [address]    Report Status 11/17/2020 FINAL  Final       Radiology Studies: US RENAL  Result Date: 11/17/2020 CLINICAL DATA:  Acute kidney injury. EXAM: RENAL / URINARY TRACT ULTRASOUND COMPLETE COMPARISON:  Abdomen and pelvis CT dated 11/15/2020. FINDINGS: Right Kidney: Renal measurements: 12.3 x 6.8 x 6.6 cm = volume: 289 mL. 7 mm mid right renal calculus. Mildly echogenic. No hydronephrosis. Left Kidney: Renal measurements: 12.5 x 6.9 x 6.2 cm = volume: 282 mL. Normal echotexture. No hydronephrosis. 3.5 cm exophytic lower pole cyst. This contains minimal internal debris with no solid components. Faintly visualized small calculi. Bladder: Appears normal for degree of bladder distention. Other: None. IMPRESSION: 1. Mildly echogenic right kidney the palpable mild medical renal disease. 2. No hydronephrosis. 3. Small, nonobstructing bilateral renal calculi. 4. 3.5 cm minimally complicated, exophytic cyst arising from the lower pole of the left kidney. No solid components seen. Electronically Signed   By: Beckie SaltsSteven  Reid M.D.   On: 11/17/2020 16:39    Scheduled Meds:  amLODipine  10 mg Oral Daily   aspirin EC  81 mg Oral Daily   carvedilol   25 mg Oral BID   clopidogrel  75 mg Oral Daily   dexamethasone  6 mg Oral Daily   heparin  5,000 Units Subcutaneous Q8H   insulin aspart  0-5 Units Subcutaneous QHS   insulin aspart  0-9 Units Subcutaneous TID WC   insulin glargine  10 Units Subcutaneous Daily   Continuous Infusions:  lactated ringers 125 mL/hr at 11/17/20 1916   remdesivir 100 mg in NS 100 mL 100 mg (11/18/20 0911)     LOS: 2 days   Time spent: 28 minutes   Hughie Clossavi Dailah Opperman, MD [facility]  11/18/2020, 1:22 PM   How to contact the [facility] Attending or Consulting provider 7A - 7P or covering provider during after hours 7P -7A, for this patient?  Check the care team in [facility] and look for a) attending/consulting [facility] provider listed and b) the [facility] team listed. Page or secure chat 7A-7P. Log into [email or web address] and use [facility] universal password to access. If you do not have the password, please contact the hospital operator. Locate the [facility] provider you are looking for under [facility] and page to a number that you can be directly reached. If you still have difficulty reaching the provider, please page the [facility] (Director on Call) for the Hospitalists listed on amion for assistance.

## 2020-11-18 NOTE — Progress Notes (Signed)
Evaluating Patient orders and see Strict I/O.  RN assisted patient to bathroom prior to this and there was not a hat in the bathroom and that only urine counts had been done to this point.  RN placed hat in toilet educated patient that we are tracing input and output and needs to track urine volume output closely to aide in evaluating kidney function.  Pt verbalized understanding

## 2020-11-19 DIAGNOSIS — N179 Acute kidney failure, unspecified: Secondary | ICD-10-CM | POA: Diagnosis not present

## 2020-11-19 LAB — COMPREHENSIVE METABOLIC PANEL
ALT: 39 U/L (ref 0–44)
AST: 42 U/L — ABNORMAL HIGH (ref 15–41)
Albumin: 3 g/dL — ABNORMAL LOW (ref 3.5–5.0)
Alkaline Phosphatase: 109 U/L (ref 38–126)
Anion gap: 9 (ref 5–15)
BUN: 39 mg/dL — ABNORMAL HIGH (ref 8–23)
CO2: 23 mmol/L (ref 22–32)
Calcium: 9.8 mg/dL (ref 8.9–10.3)
Chloride: 104 mmol/L (ref 98–111)
Creatinine, Ser: 2.26 mg/dL — ABNORMAL HIGH (ref 0.44–1.00)
GFR, Estimated: 23 mL/min — ABNORMAL LOW (ref >60)
Glucose, Bld: 170 mg/dL — ABNORMAL HIGH (ref 70–99)
Potassium: 3.4 mmol/L — ABNORMAL LOW (ref 3.5–5.1)
Sodium: 136 mmol/L (ref 135–145)
Total Bilirubin: 0.2 mg/dL — ABNORMAL LOW (ref 0.3–1.2)
Total Protein: 6.4 g/dL — ABNORMAL LOW (ref 6.5–8.1)

## 2020-11-19 LAB — GLUCOSE, CAPILLARY: Glucose-Capillary: 124 mg/dL — ABNORMAL HIGH (ref 70–99)

## 2020-11-19 LAB — MAGNESIUM: Magnesium: 1.6 mg/dL — ABNORMAL LOW (ref 1.7–2.4)

## 2020-11-19 MED ORDER — HYDRALAZINE HCL 25 MG PO TABS: 25.0000 mg | ORAL_TABLET | Freq: Three times a day (TID) | ORAL | 0 refills | Status: DC

## 2020-11-19 MED ORDER — DEXAMETHASONE 6 MG PO TABS: 6.0000 mg | ORAL_TABLET | Freq: Every day | ORAL | 0 refills | Status: AC

## 2020-11-19 MED ORDER — MAGNESIUM SULFATE 2 GM/50ML IV SOLN
2.0000 g | Freq: Once | INTRAVENOUS | Status: AC
  Administered 2020-11-19: 2 g via INTRAVENOUS
  Filled 2020-11-19: qty 50

## 2020-11-19 MED ORDER — POTASSIUM CHLORIDE CRYS ER 20 MEQ PO TBCR
40.0000 meq | EXTENDED_RELEASE_TABLET | Freq: Once | ORAL | Status: AC
  Administered 2020-11-19: 40 meq via ORAL
  Filled 2020-11-19: qty 2

## 2020-11-19 NOTE — Consult Note (Signed)
Renal Service Consult Note [facility]  Danielle Fisher 11/19/2020 [name], MD Requesting Physician: Dr Jacqulyn Bath  Reason for Consult: Renal failure HPI: The patient is a 65 y.o. year-old w/ hx of IDDM, CAD, HTN, HL, obesity who was sent to ED for abnormal kidney labs done by the [facility] and her cardiologist.  Creat was around 3.  Pt came to the ED and was found to have creat 3.7, low K+ 2.8 and she tested  + for COVID. She states she was not symptomatic.  She takes an ARB at home and was recently started on diuretic 3 wks ago for BP control.  Pt was admitted, ARB and diuretic held and IVF"s given and creat came down to 3.3 > 2.9 > 2.5 > and down to 2.2 today.  Last creat here in April 2022 was 1.1. Asked to see for renal failure.    Pt seen in room, pt denies any recent N/V/D or other problems.  No hx renal failure. Was taken off of nsaids by her PCP years ago, takes OTC tylenol only.  No voiding issues, no edema or SOB/ cough/ flank pain.     ROS - denies CP, no joint pain, no HA, no blurry vision, no rash, no diarrhea, no nausea/ vomiting, no dysuria, no difficulty voiding   Past Medical History  Past Medical History:  Diagnosis Date   Arthritis    "legs, arms" (04/20/2015)   CAD (coronary artery disease)    a. NSTEMI -> LHC 04/21/15: s/p DES to distal RCA; residual moderate ostial rPDA but vessel is overall small; EF 55-65%.   GERD (gastroesophageal reflux disease)    Hyperlipidemia    Hypertension    Kidney stones    Obesity    Thyromegaly 12/21/2012   See CT chest 8/13 /14     Type II diabetes mellitus (HCC)    Vitamin D deficiency 11/06/2016   Past Surgical History  Past Surgical History:  Procedure Laterality Date   CARDIAC CATHETERIZATION N/A 04/21/2015   Procedure: Left Heart Cath and Coronary Angiography;  Surgeon: Iran Ouch, MD;  Location: [facility] INVASIVE CV LAB;  Service: Cardiovascular;  Laterality: N/A;   CARDIAC CATHETERIZATION N/A 04/21/2015    Procedure: Coronary Stent Intervention;  Surgeon: Iran Ouch, MD;  Location: [facility] INVASIVE CV LAB;  Service: Cardiovascular;  Laterality: N/A;   CESAREAN SECTION  1980   DILATION AND CURETTAGE OF UTERUS  1980's X 1   Family History  Family History  Problem Relation Age of Onset   Heart disease Mother    Stroke Mother    Breast cancer Sister    Diabetes Sister    Stroke Father    Diabetes Father    Diabetes Sister    Social History  reports that she has never smoked. She has never used smokeless tobacco. She reports that she does not drink alcohol and does not use drugs. Allergies  Allergies  Allergen Reactions   Lisinopril Cough   Atorvastatin Rash   Simvastatin Rash and Other (See Comments)    Muscle pain   Home medications Prior to Admission medications   Medication Sig Start Date End Date Taking? Authorizing Provider  amLODipine (NORVASC) 10 MG tablet Take 10 mg by mouth daily.   Yes [provider]  aspirin EC 81 MG tablet Take 81 mg by mouth daily.   Yes [provider]  carvedilol (COREG) 25 MG tablet Take 1 tablet (25 mg total) by mouth 2 (two) times  daily. 04/24/18  Yes Nahser, [name], MD  chlorthalidone (HYGROTON) 25 MG tablet Take 25 mg by mouth every morning. 10/13/20  Yes [provider]  clopidogrel (PLAVIX) 75 MG tablet Take 1 tablet (75 mg total) by mouth daily. 04/24/18  Yes Nahser, [name], MD  hydrochlorothiazide (HYDRODIURIL) 25 MG tablet Take 25 mg by mouth daily.   Yes [provider]  insulin glargine (LANTUS) 100 UNIT/ML injection Inject 30 Units into the skin daily.   Yes [provider]  losartan (COZAAR) 100 MG tablet Take 1 tablet (100 mg total) by mouth daily. 04/29/15  Yes Dunn, Dayna N, PA-C  nitroGLYCERIN (NITROSTAT) 0.4 MG SL tablet Place 1 tablet (0.4 mg total) under the tongue every 5 (five) minutes as needed for chest pain (up to 3 doses). 04/22/15  Yes Dunn, Dayna N, PA-C  rosuvastatin  (CRESTOR) 40 MG tablet Take 1 tablet (40 mg total) by mouth daily. Patient taking differently: Take 40 mg by mouth every evening. 07/12/20  Yes Nahser, [name], MD  SEMAGLUTIDE, 1 MG/DOSE, [place] Inject 1 mg into the skin every Sunday.   Yes [provider]  glucose blood (ONETOUCH ULTRA) test strip 1 each by Other route as needed. 11/01/17   [provider]     Vitals:   11/18/20 1159 11/18/20 2223 11/19/20 0402 11/19/20 0938  BP: 137/71 (!) 141/72 (!) 146/77   Pulse: 70 72 70 67  Resp: 16 20 20    Temp: 98.4 F (36.9 C) 98.1 F (36.7 C) 98.3 F (36.8 C)   TempSrc: Oral Oral Oral   SpO2: 100% 100% 100% 100%  Weight:      Height:       Exam Gen alert, no distress No rash, cyanosis or gangrene Sclera anicteric, throat clear  No jvd or bruits Chest clear bilat to bases, no rales/ wheezing RRR no MRG Abd soft ntnd no mass or ascites +bs GU normal MS no joint effusions or deformity Ext no LE or UE edema, no wounds or ulcers Neuro is alert, Ox 3 , nf     CXR - no acute disease    Renal - 12.3 / 12.5 cm kidneys w/o hydro    Renal stone CT - Kidneys are well visualized bilaterally. Bilateral renal calculi are seen right slightly greater than left the largest of which measures 4-5 mm in greatest dimension. No obstructive changes are seen. The bladder is decompressed.     UA 100 prot, SG 1.020,  many bact, gran casts, , 0-5 wbc/ rbc  Assessment/ Plan: AKI - in patient w/ normal renal function at baseline.  AKI resolving w/ IVF"s and holding off home ARB/ HTCZ.  The HCTZ was just started 3 wks ago. Suspect that w/ new diuretics and new COVID she is dehydrated, and the AKI is related to ARB effect and dehydration.  She is in recovery phase, creat down to 2.2 from 3.7 on admission here.  UA w/o sign of GN.  Renal US and abd CT show normal appearing kidneys.  Suspect will have full recovery.  Would avoid ARB / ACEi and diuretics for BP control for 2-3 mos. Will arrange f/u  appt w/ nephrology at [facility].  Ok for dc , will sign off.  COVID + infection HTN - as above IDDM HL      [place]  MD 11/19/2020, 10:38 AM  Recent Labs  Lab 11/17/20 0346 11/18/20 0328  WBC 8.6 5.3  HGB 11.4* 10.5*  Recent Labs  Lab 11/16/20 0226 11/17/20 0346 11/18/20 0328 11/19/20 0335  K 2.8* 3.2* 3.5 3.4*  BUN 49* 35* 36* 39*  CREATININE 3.36*  3.32* 2.97* 2.53* 2.26*  CALCIUM 10.1 10.1 9.7 9.8  PHOS 3.5 3.3  --   --

## 2020-11-19 NOTE — Discharge Summary (Signed)
Physician Discharge Summary  Danielle Fisher ZOX:[id number] DOB: 01/22/1956 DOA: 11/15/2020  PCP: Agustina Caroli, MD  Admit date: 11/15/2020 Discharge date: 11/19/2020 30 Day Unplanned Readmission Risk Score    Flowsheet Row ED to Hosp-Admission (Current) from 11/15/2020 in [place] 4TH FLOOR PROGRESSIVE CARE AND UROLOGY  30 Day Unplanned Readmission Risk Score (%) 17.47 Filed at 11/19/2020 0801       This score is the patient's risk of an unplanned readmission within 30 days of being discharged (0 -100%). The score is based on dignosis, age, lab data, medications, orders, and past utilization.   Low:  0-14.9   Medium: 15-21.9   High: 22-29.9   Extreme: 30 and above          Admitted From: Home Disposition: Home  Recommendations for Outpatient Follow-up:  Follow up with PCP in 1-2 weeks Follow-up with nephrology in 4 to 6 weeks as nephrology has documented in the note Hyperdense cyst in left kidney Needs nonemergent renal US for further evaluation as outpatient. Please obtain BMP/CBC in one week Please follow up with your PCP on the following pending results: Unresulted Labs (From admission, onward)     Start     Ordered   11/19/20 0500  Comprehensive metabolic panel  Daily,   R     Question:  Specimen collection method  Answer:  Lab=Lab collect   11/18/20 1011              Home Health: None Equipment/Devices: None  Discharge Condition: Stable CODE STATUS: Full code Diet recommendation: Cardiac  Subjective: Seen and examined.  Feels well.  No shortness of breath or fever or other complaint.  Wants to go home.  Brief/Interim Summary: This 65 yrs old female with PMH significant for CAD s/p stents, GERD, HTN, T2DM and other comorbid conditions presented with abnormal labs.  Patient had lab work with [facility] and cardiology last week. She was out of town in [facility] and she has received a call over the weekend that her creatinine is high and was advised to go to  urgent care.  Patient came back in the ED and was admitted. She reports mild lightheadedness, low back pain.  Patient admitted for acute kidney injury and was started on IV hydration,  serum creatinine is improving.  CT abdomen negative for renal stones.  Patient also found to be COVID-positive and started on remdesivir.  She had fever of 100.8 and thus she was also started on dexamethasone.  She never was hypoxic.  Patient's AKI was thought to be prerenal secondary to dehydration.  She was started on IV fluids and after that, her creatinine started to improve.  Acute Kidney Injury 2/2 Acute Tubular Necrosis Baseline creatinine 1.12, 3.75 at presentation and improved to 2.6 today. Suspect ATN 2/2 hypotension/hypovolemia related to new BP med, HCTZ started about 1 month ago. UA shows granular casts c/w ATN CT stone study without obstructive changes.  HCTZ and chlorthalidone as well as losartan were all held.  Patient has had significant urine output of about 3 L in last 24 hours.  By now, I had expected that patient's creatinine would be normal or significantly improved but it came down from 2.53 yesterday to only 2.26 today so nephrology was consulted.  Nephrology also opined that this is likely dehydration and in fact recommended the patient can be discharged home and they will follow-up with her in 4 to 6 weeks.  Nephrology recommended holding her HCTZ and losartan until she is  seen by nephrology.  Patient is advised to keep herself hydrated well.   Hypokalemia: 3.4 again today.  She was given 1 dose of potassium chloride replacement today before discharge.   Abnormal UA  Pyuria No clear UTI symptoms, no need to treat.   CAD s/p Stent Continue aspirin/plavix and crestor Continue coreg   Hypertension: Blood pressure controlled. Discontinuing losartan and HCTZ Continue coreg and amlodipine and added hydralazine 25 mg p.o. 3 times daily.   Hyperdense cyst in left kidney Needs nonemergent renal  US for further evaluation as outpatient.    Discharge Diagnoses:  Active Problems:   HTN (hypertension)   HLD (hyperlipidemia)   Type 2 diabetes mellitus (HCC)   CAD S/P percutaneous coronary angioplasty   ARF (acute renal failure) (HCC)   AKI (acute kidney injury) (HCC)   Pneumonia due to COVID-19 virus    Discharge Instructions   Allergies as of 11/19/2020       Reactions   Lisinopril Cough   Atorvastatin Rash   Simvastatin Rash, Other (See Comments)   Muscle pain        Medication List     STOP taking these medications    chlorthalidone 25 MG tablet Commonly known as: HYGROTON   hydrochlorothiazide 25 MG tablet Commonly known as: HYDRODIURIL   losartan 100 MG tablet Commonly known as: COZAAR       TAKE these medications    amLODipine 10 MG tablet Commonly known as: NORVASC Take 10 mg by mouth daily.   aspirin EC 81 MG tablet Take 81 mg by mouth daily.   carvedilol 25 MG tablet Commonly known as: COREG Take 1 tablet (25 mg total) by mouth 2 (two) times daily.   clopidogrel 75 MG tablet Commonly known as: PLAVIX Take 1 tablet (75 mg total) by mouth daily.   dexamethasone 6 MG tablet Commonly known as: DECADRON Take 1 tablet (6 mg total) by mouth daily for 7 days. Start taking on: November 20, 2020   hydrALAZINE 25 MG tablet Commonly known as: APRESOLINE Take 1 tablet (25 mg total) by mouth 3 (three) times daily.   insulin glargine 100 UNIT/ML injection Commonly known as: LANTUS Inject 30 Units into the skin daily.   nitroGLYCERIN 0.4 MG SL tablet Commonly known as: Nitrostat Place 1 tablet (0.4 mg total) under the tongue every 5 (five) minutes as needed for chest pain (up to 3 doses).   OneTouch Ultra test strip Generic drug: glucose blood 1 each by Other route as needed.   rosuvastatin 40 MG tablet Commonly known as: CRESTOR Take 1 tablet (40 mg total) by mouth daily. What changed: when to take this   SEMAGLUTIDE (1 MG/DOSE)  [place] Inject 1 mg into the skin every Sunday.        Follow-up Information     Agustina Caroli, MD Follow up in 1 week(s).   Contact information: [address] [place] [address] [phone]         Nahser, [name], MD .   Specialty: Cardiology Contact information: [address] [place] [address] [phone]                Allergies  Allergen Reactions   Lisinopril Cough   Atorvastatin Rash   Simvastatin Rash and Other (See Comments)    Muscle pain    Consultations: Nephrology   Procedures/Studies: US RENAL  Result Date: 11/17/2020 CLINICAL DATA:  Acute kidney injury. EXAM: RENAL / URINARY TRACT ULTRASOUND COMPLETE COMPARISON:  Abdomen and pelvis CT dated 11/15/2020. FINDINGS: Right Kidney: Renal measurements: 12.3 x 6.8 x 6.6 cm = volume: 289 mL. 7 mm mid right renal calculus. Mildly echogenic. No hydronephrosis. Left Kidney: Renal measurements: 12.5 x 6.9 x 6.2 cm = volume: 282 mL. Normal echotexture. No hydronephrosis. 3.5 cm exophytic lower pole cyst. This contains minimal internal debris with no solid components. Faintly visualized small calculi. Bladder: Appears normal for degree of bladder distention. Other: None. IMPRESSION: 1. Mildly echogenic right kidney the palpable mild medical renal disease. 2. No hydronephrosis. 3. Small, nonobstructing bilateral renal calculi. 4. 3.5 cm minimally complicated, exophytic cyst arising from the lower pole of the left kidney. No solid components seen. Electronically Signed   By: Beckie Salts M.D.   On: 11/17/2020 16:39   DG CHEST PORT 1 VIEW  Result Date: 11/16/2020 CLINICAL DATA:  COVID EXAM: PORTABLE CHEST 1 VIEW COMPARISON:  Radiograph 04/20/2015, CT 12/18/2012 FINDINGS: Low lung volumes. Streaky and ill-defined patchy opacities are present in the mid to lower lungs which may reflect further volume loss or developing airspace opacity in the setting of COVID 19. No pneumothorax or visible  effusion. Cardiomediastinal contours are unremarkable for the portable technique. No acute osseous or chest wall abnormality. IMPRESSION: Low lung volumes. Streaky and patchy opacities in the lung bases may reflect atelectasis or developing airspace disease in the setting of COVID-19. Electronically Signed   By: Kreg Shropshire M.D.   On: 11/16/2020 01:39   CT Renal Stone Study  Result Date: 11/15/2020 CLINICAL DATA:  Flank pain EXAM: CT ABDOMEN AND PELVIS WITHOUT CONTRAST TECHNIQUE: Multidetector CT imaging of the abdomen and pelvis was performed following the standard protocol without IV contrast. COMPARISON:  None. FINDINGS: Lower chest: No acute abnormality. Hepatobiliary: No focal liver abnormality is seen. No gallstones, gallbladder wall thickening, or biliary dilatation. Pancreas: Unremarkable. No pancreatic ductal dilatation or surrounding inflammatory changes. Spleen: Normal in size without focal abnormality. Adrenals/Urinary Tract: Adrenal glands are within normal limits. Kidneys are well visualized bilaterally. Bilateral renal calculi are seen right slightly greater than left the largest of which measures 4-5 mm in greatest dimension. No obstructive changes are seen. The bladder is decompressed. Hyperdense cyst is noted posteriorly in the left kidney measuring 3.2 mm in greatest dimension. This likely represents hemorrhagic cyst but is incompletely evaluated on this exam. Stomach/Bowel: No obstructive or inflammatory changes are noted within the colon. The appendix is within normal limits. Small bowel and stomach are unremarkable. Vascular/Lymphatic: Aortic atherosclerosis. No enlarged abdominal or pelvic lymph nodes. Reproductive: Uterus and bilateral adnexa are unremarkable. Other: No abdominal wall hernia or abnormality. No abdominopelvic ascites. Musculoskeletal: No acute or significant osseous findings. IMPRESSION: Bilateral renal calculi without obstructive change. Hyperdense cyst in the left  kidney posteriorly as described. It demonstrates some focal calcifications but is incompletely evaluated on this exam. Nonemergent renal ultrasound would be helpful for further evaluation. No other focal abnormality is noted. Electronically Signed   By: Alcide Clever M.D.   On: 11/15/2020 23:23     Discharge Exam: Vitals:   11/19/20 0402 11/19/20 0938  BP: (!) 146/77   Pulse: 70 67  Resp: 20   Temp: 98.3 F (36.8 C)   SpO2: 100% 100%   Vitals:   11/18/20 1159 11/18/20 2223 11/19/20 0402 11/19/20 0938  BP: 137/71 (!) 141/72 (!) 146/77   Pulse: 70 72 70 67  Resp: Temp: 98.4 F (36.9 C) 98.1 F (36.7 C) 98.3 F (36.8 C)  TempSrc: Oral Oral Oral   SpO2: 100% 100% 100% 100%  Weight:      Height:        General: Pt is alert, awake, not in acute distress Cardiovascular: RRR, S1/S2 +, no rubs, no gallops Respiratory: CTA bilaterally, no wheezing, no rhonchi Abdominal: Soft, NT, ND, bowel sounds + Extremities: no edema, no cyanosis    The results of significant diagnostics from this hospitalization (including imaging, microbiology, ancillary and laboratory) are listed below for reference.     Microbiology: Recent Results (from the past 240 hour(s))  Resp Panel by RT-PCR (Flu A&B, Covid) Nasopharyngeal Swab     Status: Abnormal   Collection Time: 11/15/20  7:36 PM   Specimen: Nasopharyngeal Swab; Nasopharyngeal(NP) swabs in vial transport medium  Result Value Ref Range Status   SARS Coronavirus 2 by RT PCR POSITIVE (A) NEGATIVE Final    Comment: RESULT CALLED TO, READ BACK BY AND VERIFIED WITH: Riki Altes, RN AT 2100 ON [id number] BY BOWLBY, J (NOTE) SARS-CoV-2 target nucleic acids are DETECTED.  The SARS-CoV-2 RNA is generally detectable in upper respiratory specimens during the acute phase of infection. Positive results are indicative of the presence of the identified virus, but do not rule out bacterial infection or co-infection with other pathogens  not detected by the test. Clinical correlation with patient history and other diagnostic information is necessary to determine patient infection status. The expected result is Negative.  Fact Sheet for Patients: BloggerCourse.com  Fact Sheet for Healthcare Providers: SeriousBroker.it  This test is not yet approved or cleared by the [place] FDA and  has been authorized for detection and/or diagnosis of SARS-CoV-2 by FDA under an Emergency Use Authorization (EUA).  This EUA will remain in effect (meaning thi s test can be used) for the duration of  the COVID-19 declaration under Section 564(b)(1) of the Act, 21 U.S.C. section 360bbb-3(b)(1), unless the authorization is terminated or revoked sooner.     Influenza A by PCR NEGATIVE NEGATIVE Final   Influenza B by PCR NEGATIVE NEGATIVE Final    Comment: (NOTE) The Xpert Xpress SARS-CoV-2/FLU/RSV plus assay is intended as an aid in the diagnosis of influenza from Nasopharyngeal swab specimens and should not be used as a sole basis for treatment. Nasal washings and aspirates are unacceptable for Xpert Xpress SARS-CoV-2/FLU/RSV testing.  Fact Sheet for Patients: BloggerCourse.com  Fact Sheet for Healthcare Providers: SeriousBroker.it  This test is not yet approved or cleared by the [place] FDA and has been authorized for detection and/or diagnosis of SARS-CoV-2 by FDA under an Emergency Use Authorization (EUA). This EUA will remain in effect (meaning this test can be used) for the duration of the COVID-19 declaration under Section 564(b)(1) of the Act, 21 U.S.C. section 360bbb-3(b)(1), unless the authorization is terminated or revoked.  Performed at [facility], [address] [place], [place] [address]   Culture, Urine     Status: None   Collection Time: 11/16/20  6:10 AM   Specimen: Urine, Clean Catch   Result Value Ref Range Status   Specimen Description   Final    URINE, CLEAN CATCH Performed at [facility], [address] [place], [place] [address]    Special Requests   Final    NONE Performed at [facility], [address] [place], [place] [address]    Culture   Final    NO GROWTH Performed at [facility], [address] [place],  [place] [address]    Report Status 11/17/2020 FINAL  Final     Labs: BNP (last 3 results) No results for input(s): BNP in the last 8760 hours. Basic Metabolic Panel: Recent Labs  Lab 11/15/20 2040 11/16/20 0226 11/17/20 0346 11/18/20 0328 11/19/20 0335  NA 139 142 139 137 136  K 2.8* 2.8* 3.2* 3.5 3.4*  CL 103 109 108 108 104  CO2 26 22 21* 22 23  GLUCOSE 98 101* 68* 185* 170*  BUN 50* 49* 35* 36* 39*  CREATININE 3.75* 3.36*  3.32* 2.97* 2.53* 2.26*  CALCIUM 10.1 10.1 10.1 9.7 9.8  MG  --  2.4 1.7  --  1.6*  PHOS  --  3.5 3.3  --   --    Liver Function Tests: Recent Labs  Lab 11/15/20 2040 11/16/20 0226 11/19/20 0335  AST 37 35 42*  ALT 37 34 39  ALKPHOS 153* 124 109  BILITOT 0.4 0.5 0.2*  PROT 8.0 7.1 6.4*  ALBUMIN 3.6 3.2* 3.0*   No results for input(s): LIPASE, AMYLASE in the last 168 hours. No results for input(s): AMMONIA in the last 168 hours. CBC: Recent Labs  Lab 11/15/20 2010 11/16/20 0226 11/17/20 0346 11/18/20 0328  WBC 9.9 9.6 8.6 5.3  NEUTROABS 7.3  --   --  3.9  HGB 12.5 10.6* 11.4* 10.5*  HCT 37.8 32.8* 35.8* 33.1*  MCV 83.8 84.5 85.9 85.1  PLT 325 272 254 257   Cardiac Enzymes: Recent Labs  Lab 11/16/20 0226  CKTOTAL 156   BNP: Invalid input(s): POCBNP CBG: Recent Labs  Lab 11/18/20 0812 11/18/20 1154 11/18/20 1741 11/18/20 2219 11/19/20 0820  GLUCAP 115* 156* 246* 293* 124*   D-Dimer No results for input(s): DDIMER in the last 72 hours. Hgb A1c No results for input(s): HGBA1C in the last 72 hours. Lipid Profile No results for  input(s): CHOL, HDL, LDLCALC, TRIG, CHOLHDL, LDLDIRECT in the last 72 hours. Thyroid function studies No results for input(s): TSH, T4TOTAL, T3FREE, THYROIDAB in the last 72 hours.  Invalid input(s): FREET3 Anemia work up No results for input(s): VITAMINB12, FOLATE, FERRITIN, TIBC, IRON, RETICCTPCT in the last 72 hours. Urinalysis    Component Value Date/Time   COLORURINE YELLOW 11/15/2020 2000   APPEARANCEUR HAZY (A) 11/15/2020 2000   LABSPEC 1.020 11/15/2020 2000   PHURINE 5.5 11/15/2020 2000   GLUCOSEU NEGATIVE 11/15/2020 2000   HGBUR MODERATE (A) 11/15/2020 2000   BILIRUBINUR NEGATIVE 11/15/2020 2000   KETONESUR NEGATIVE 11/15/2020 2000   PROTEINUR 100 (A) 11/15/2020 2000   UROBILINOGEN 0.2 07/19/2011 2156   NITRITE NEGATIVE 11/15/2020 2000   LEUKOCYTESUR NEGATIVE 11/15/2020 2000   Sepsis Labs Invalid input(s): PROCALCITONIN,  WBC,  LACTICIDVEN Microbiology Recent Results (from the past 240 hour(s))  Resp Panel by RT-PCR (Flu A&B, Covid) Nasopharyngeal Swab     Status: Abnormal   Collection Time: 11/15/20  7:36 PM   Specimen: Nasopharyngeal Swab; Nasopharyngeal(NP) swabs in vial transport medium  Result Value Ref Range Status   SARS Coronavirus 2 by RT PCR POSITIVE (A) NEGATIVE Final    Comment: RESULT CALLED TO, READ BACK BY AND VERIFIED WITH: Riki Altes, RN AT 2100 ON [id number] BY BOWLBY, J (NOTE) SARS-CoV-2 target nucleic acids are DETECTED.  The SARS-CoV-2 RNA is generally detectable in upper respiratory specimens during the acute phase of infection. Positive results are indicative of the presence of the identified virus, but do not rule out bacterial infection or co-infection with other pathogens not detected by the  test. Clinical correlation with patient history and other diagnostic information is necessary to determine patient infection status. The expected result is Negative.  Fact Sheet for Patients: BloggerCourse.comhttps://www.fda.gov/media/152166/download  Fact Sheet for  Healthcare Providers: SeriousBroker.ithttps://www.fda.gov/media/152162/download  This test is not yet approved or cleared by the [place] FDA and  has been authorized for detection and/or diagnosis of SARS-CoV-2 by FDA under an Emergency Use Authorization (EUA).  This EUA will remain in effect (meaning thi s test can be used) for the duration of  the COVID-19 declaration under Section 564(b)(1) of the Act, 21 U.S.C. section 360bbb-3(b)(1), unless the authorization is terminated or revoked sooner.     Influenza A by PCR NEGATIVE NEGATIVE Final   Influenza B by PCR NEGATIVE NEGATIVE Final    Comment: (NOTE) The Xpert Xpress SARS-CoV-2/FLU/RSV plus assay is intended as an aid in the diagnosis of influenza from Nasopharyngeal swab specimens and should not be used as a sole basis for treatment. Nasal washings and aspirates are unacceptable for Xpert Xpress SARS-CoV-2/FLU/RSV testing.  Fact Sheet for Patients: BloggerCourse.comhttps://www.fda.gov/media/152166/download  Fact Sheet for Healthcare Providers: SeriousBroker.ithttps://www.fda.gov/media/152162/download  This test is not yet approved or cleared by the [place] FDA and has been authorized for detection and/or diagnosis of SARS-CoV-2 by FDA under an Emergency Use Authorization (EUA). This EUA will remain in effect (meaning this test can be used) for the duration of the COVID-19 declaration under Section 564(b)(1) of the Act, 21 U.S.C. section 360bbb-3(b)(1), unless the authorization is terminated or revoked.  Performed at [facility], [address] [place], [place] [address]   Culture, Urine     Status: None   Collection Time: 11/16/20  6:10 AM   Specimen: Urine, Clean Catch  Result Value Ref Range Status   Specimen Description   Final    URINE, CLEAN CATCH Performed at [facility], [address] [place], [place] [address]    Special Requests   Final    NONE Performed at [facility], [address] [place], [place] [address]    Culture   Final    NO GROWTH Performed at [facility], [address] [place], [place] [address]    Report Status 11/17/2020 FINAL  Final     Time coordinating discharge: Over 30 minutes  SIGNED:   Hughie Clossavi Shirrell Solinger, MD  [facility] 11/19/2020, 10:57 AM  If 7PM-7AM, please contact night-coverage [email or web address]

## 2020-11-29 ENCOUNTER — Telehealth: Payer: Self-pay | Admitting: Cardiovascular Disease

## 2020-11-29 ENCOUNTER — Encounter: Payer: Self-pay | Admitting: Cardiology

## 2020-11-29 NOTE — Telephone Encounter (Signed)
error 

## 2020-11-29 NOTE — Telephone Encounter (Signed)
Patient states the hospital told her to get in contact with Dr. Elease Hashimoto since she was in the hospital since 7/11-7/15.

## 2020-11-29 NOTE — Telephone Encounter (Signed)
Called pt back in regards to contacting Dr. Elease Hashimoto.  She reports that she was told to f/u with Dr. Elease Hashimoto at discharge from the recent hospital stay. I advised her that DC summary states that she is to hold HCTZ and losartan until after f/u visit with nephrology.  She reports appointment is scheduled for beginning of August.  I advised her to f/u with nephrology first for there recommendations prior to seeing Dr. Elease Hashimoto.  I also advised her to check daily BP readings to ensure that readings are not elevated.  She reports that she has a BP cuff and will start daily BP readings.  I advised her to call back if she has any further concerns.  Will route to MD for possible further advisement.

## 2020-11-30 NOTE — Telephone Encounter (Signed)
Pt aware of recommendations. She had no additional questions.  

## 2021-01-21 ENCOUNTER — Other Ambulatory Visit: Payer: Self-pay | Admitting: Neurology

## 2021-01-21 MED ORDER — CARVEDILOL 25 MG PO TABS: 25.0000 mg | ORAL_TABLET | Freq: Two times a day (BID) | ORAL | 0 refills | Status: DC

## 2021-02-09 ENCOUNTER — Other Ambulatory Visit: Payer: Self-pay

## 2021-02-09 ENCOUNTER — Emergency Department (HOSPITAL_BASED_OUTPATIENT_CLINIC_OR_DEPARTMENT_OTHER): Payer: No Typology Code available for payment source

## 2021-02-09 ENCOUNTER — Emergency Department (HOSPITAL_BASED_OUTPATIENT_CLINIC_OR_DEPARTMENT_OTHER)
Admission: EM | Admit: 2021-02-09 | Discharge: 2021-02-09 | Disposition: A | Discharge status: Home / Self Care | Payer: No Typology Code available for payment source | Attending: Emergency Medicine | Admitting: Emergency Medicine

## 2021-02-09 ENCOUNTER — Encounter (HOSPITAL_BASED_OUTPATIENT_CLINIC_OR_DEPARTMENT_OTHER): Payer: Self-pay

## 2021-02-09 DIAGNOSIS — Z7982 Long term (current) use of aspirin: Secondary | ICD-10-CM | POA: Diagnosis not present

## 2021-02-09 DIAGNOSIS — Z7984 Long term (current) use of oral hypoglycemic drugs: Secondary | ICD-10-CM | POA: Insufficient documentation

## 2021-02-09 DIAGNOSIS — J209 Acute bronchitis, unspecified: Secondary | ICD-10-CM | POA: Insufficient documentation

## 2021-02-09 DIAGNOSIS — Z8616 Personal history of COVID-19: Secondary | ICD-10-CM | POA: Insufficient documentation

## 2021-02-09 DIAGNOSIS — Z7902 Long term (current) use of antithrombotics/antiplatelets: Secondary | ICD-10-CM | POA: Insufficient documentation

## 2021-02-09 DIAGNOSIS — I2510 Atherosclerotic heart disease of native coronary artery without angina pectoris: Secondary | ICD-10-CM | POA: Diagnosis not present

## 2021-02-09 DIAGNOSIS — Z79899 Other long term (current) drug therapy: Secondary | ICD-10-CM | POA: Insufficient documentation

## 2021-02-09 DIAGNOSIS — I10 Essential (primary) hypertension: Secondary | ICD-10-CM | POA: Diagnosis not present

## 2021-02-09 DIAGNOSIS — J40 Bronchitis, not specified as acute or chronic: Secondary | ICD-10-CM

## 2021-02-09 DIAGNOSIS — R0602 Shortness of breath: Secondary | ICD-10-CM | POA: Diagnosis present

## 2021-02-09 DIAGNOSIS — Z794 Long term (current) use of insulin: Secondary | ICD-10-CM | POA: Diagnosis not present

## 2021-02-09 DIAGNOSIS — E119 Type 2 diabetes mellitus without complications: Secondary | ICD-10-CM | POA: Insufficient documentation

## 2021-02-09 DIAGNOSIS — Z955 Presence of coronary angioplasty implant and graft: Secondary | ICD-10-CM | POA: Diagnosis not present

## 2021-02-09 DIAGNOSIS — R059 Cough, unspecified: Secondary | ICD-10-CM | POA: Diagnosis present

## 2021-02-09 DIAGNOSIS — R053 Chronic cough: Secondary | ICD-10-CM | POA: Diagnosis present

## 2021-02-09 LAB — CBC
HCT: 37.6 % (ref 36.0–46.0)
Hemoglobin: 12 g/dL (ref 12.0–15.0)
MCH: 27.3 pg (ref 26.0–34.0)
MCHC: 31.9 g/dL (ref 30.0–36.0)
MCV: 85.6 fL (ref 80.0–100.0)
Platelets: 383 10*3/uL (ref 150–400)
RBC: 4.39 MIL/uL (ref 3.87–5.11)
RDW: 15.1 % (ref 11.5–15.5)
WBC: 7.8 10*3/uL (ref 4.0–10.5)
nRBC: 0 % (ref 0.0–0.2)

## 2021-02-09 LAB — BASIC METABOLIC PANEL
Anion gap: 5 (ref 5–15)
BUN: 15 mg/dL (ref 8–23)
CO2: 24 mmol/L (ref 22–32)
Calcium: 10.3 mg/dL (ref 8.9–10.3)
Chloride: 110 mmol/L (ref 98–111)
Creatinine, Ser: 0.85 mg/dL (ref 0.44–1.00)
GFR, Estimated: >60 mL/min (ref >60)
Glucose, Bld: 122 mg/dL — ABNORMAL HIGH (ref 70–99)
Potassium: 3.8 mmol/L (ref 3.5–5.1)
Sodium: 139 mmol/L (ref 135–145)

## 2021-02-09 LAB — BRAIN NATRIURETIC PEPTIDE: B Natriuretic Peptide: 5.2 pg/mL (ref 0.0–100.0)

## 2021-02-09 MED ORDER — PREDNISONE 20 MG PO TABS: ORAL_TABLET | ORAL | 0 refills | Status: DC

## 2021-02-09 MED ORDER — HYDROCODONE BIT-HOMATROP MBR 5-1.5 MG/5ML PO SOLN: 5.0000 mL | Freq: Four times a day (QID) | ORAL | 0 refills | Status: DC | PRN

## 2021-02-09 MED ORDER — IPRATROPIUM-ALBUTEROL 0.5-2.5 (3) MG/3ML IN SOLN
3.0000 mL | Freq: Once | RESPIRATORY_TRACT | Status: AC
  Administered 2021-02-09: 3 mL via RESPIRATORY_TRACT
  Filled 2021-02-09: qty 3

## 2021-02-09 MED ORDER — ALBUTEROL SULFATE HFA 108 (90 BASE) MCG/ACT IN AERS
2.0000 | INHALATION_SPRAY | RESPIRATORY_TRACT | Status: DC | PRN
  Administered 2021-02-09: 2 via RESPIRATORY_TRACT
  Filled 2021-02-09: qty 6.7

## 2021-02-09 MED ORDER — METHYLPREDNISOLONE SODIUM SUCC 125 MG IJ SOLR
125.0000 mg | Freq: Once | INTRAMUSCULAR | Status: AC
  Administered 2021-02-09: 125 mg via INTRAVENOUS
  Filled 2021-02-09: qty 2

## 2021-02-09 NOTE — ED Triage Notes (Signed)
Pt c/o cough since having covid in July states she has felt worse with cough, SOB x 2-3 weeks-states she had +covid home test ~2.5 weeks ago-NAD-steady gait

## 2021-02-09 NOTE — Discharge Instructions (Signed)
1.  Start prednisone tomorrow as prescribed.  Use the albuterol inhaler you were given 2 puffs every 4 hours for the next 2 days then as needed. 2.  Follow-up with your doctor for recheck within the next 2 to 5 days. 3.  Return to emergency department if you have any worsening symptoms such as chest pain, increasing shortness of breath or other concerning symptoms

## 2021-02-09 NOTE — ED Provider Notes (Signed)
[facility] EMERGENCY DEPARTMENT Provider Note   CSN: [id number] Arrival date & time: 02/09/21  1636     History Chief Complaint  Patient presents with   Cough    Danielle Fisher is a 65 y.o. female.  HPI Patient reports she has had a persistent cough that was gradual in onset.  She had COVID in July and has had problems with cough waxing and waning since that time.  She reports it has been typical for her to get a cough with bronchitis a couple times a year that requires steroids to resolve.  Patient reports the last time she had steroids for cough was about a year ago.  She does not have inhalers at home that she uses regularly.  She has not been using any with this episode.  Nothing is giving her relief.  Patient reports she can still do her usual activities without significant shortness of breath.  She reports she is mildly short of breath.  She typically walks a mile for exercise.  She reports she has decreased her distance of walking.  No chest pain and no chest pain with exertion.  No lower extremity swelling or calf pain.  No fevers or body aches.  No sore throat or nasal congestion.    Past Medical History:  Diagnosis Date   Arthritis    "legs, arms" (04/20/2015)   CAD (coronary artery disease)    a. NSTEMI -> LHC 04/21/15: s/p DES to distal RCA; residual moderate ostial rPDA but vessel is overall small; EF 55-65%.   GERD (gastroesophageal reflux disease)    Hyperlipidemia    Hypertension    Kidney stones    Obesity    Thyromegaly 12/21/2012   See CT chest 8/13 /14     Type II diabetes mellitus (HCC)    Vitamin D deficiency 11/06/2016    Patient Active Problem List   Diagnosis Date Noted   Pneumonia due to COVID-19 virus 11/17/2020   AKI (acute kidney injury) (HCC) 11/16/2020   ARF (acute renal failure) (HCC) 11/15/2020   Chronic eczematous otitis externa of both ears 02/22/2018   Vitamin D deficiency 11/06/2016   Hyperparathyroidism (HCC) 11/06/2016    Hypercalcemia 10/19/2016   Coronary artery disease involving native coronary artery of native heart without angina pectoris 04/12/2016   CAD S/P percutaneous coronary angioplasty 04/22/2015   Obesity 04/22/2015   NSTEMI (non-ST elevated myocardial infarction) (HCC) 04/21/2015   HTN (hypertension) 04/21/2015   HLD (hyperlipidemia) 04/21/2015   Type 2 diabetes mellitus (HCC) 04/21/2015   Thyromegaly 12/21/2012   Cough 12/19/2012    Past Surgical History:  Procedure Laterality Date   CARDIAC CATHETERIZATION N/A 04/21/2015   Procedure: Left Heart Cath and Coronary Angiography;  Surgeon: Iran Ouch, MD;  Location: [facility];  Service: Cardiovascular;  Laterality: N/A;   CARDIAC CATHETERIZATION N/A 04/21/2015   Procedure: Coronary Stent Intervention;  Surgeon: Iran Ouch, MD;  Location: [facility];  Service: Cardiovascular;  Laterality: N/A;   CESAREAN SECTION  1980   DILATION AND CURETTAGE OF UTERUS  1980's X 1     OB History   No obstetric history on file.     Family History  Problem Relation Age of Onset   Heart disease Mother    Stroke Mother    Breast cancer Sister    Diabetes Sister    Stroke Father    Diabetes Father    Diabetes Sister     Social History  Tobacco Use   Smoking status: Never   Smokeless tobacco: Never  Vaping Use   Vaping Use: Never used  Substance Use Topics   Alcohol use: No   Drug use: No    Home Medications Prior to Admission medications   Medication Sig Start Date End Date Taking? Authorizing Provider  HYDROcodone bit-homatropine (HYCODAN) 5-1.5 MG/5ML syrup Take 5 mLs by mouth every 6 (six) hours as needed for cough. 02/09/21  Yes Arby Barrette, MD  predniSONE (DELTASONE) 20 MG tablet 2 tabs po daily x 4 days 02/09/21  Yes Amery Minasyan, Lebron Conners, MD  amLODipine (NORVASC) 10 MG tablet Take 10 mg by mouth daily.    [provider]  aspirin EC 81 MG tablet Take 81 mg by mouth daily.    [provider]   carvedilol (COREG) 25 MG tablet Take 1 tablet (25 mg total) by mouth 2 (two) times daily. 01/21/21   Nahser, [name], MD  clopidogrel (PLAVIX) 75 MG tablet Take 1 tablet (75 mg total) by mouth daily. 04/24/18   Nahser, [name], MD  glucose blood (ONETOUCH ULTRA) test strip 1 each by Other route as needed. 11/01/17   [provider]  hydrALAZINE (APRESOLINE) 25 MG tablet Take 1 tablet (25 mg total) by mouth 3 (three) times daily. 11/19/20 12/19/20  Hughie Closs, MD  insulin glargine (LANTUS) 100 UNIT/ML injection Inject 30 Units into the skin daily.    [provider]  nitroGLYCERIN (NITROSTAT) 0.4 MG SL tablet Place 1 tablet (0.4 mg total) under the tongue every 5 (five) minutes as needed for chest pain (up to 3 doses). 04/22/15   Dunn, Tacey Ruiz, PA-C  rosuvastatin (CRESTOR) 40 MG tablet Take 1 tablet (40 mg total) by mouth daily. 07/12/20   Nahser, [name], MD  SEMAGLUTIDE, 1 MG/DOSE, [place] Inject 1 mg into the skin every Sunday.    [provider]    Allergies    Lisinopril, Atorvastatin, and Simvastatin  Review of Systems   Review of Systems 10 systems reviewed and negative except as per HPI Physical Exam Updated Vital Signs BP 137/69   Pulse 84   Temp 98.3 F (36.8 C) (Oral)   Resp 13   Ht 5\' 6"  (1.676 m)   Wt 90.7 kg   SpO2 96%   BMI 32.28 kg/m   Physical Exam Constitutional:      Comments: Alert nontoxic.  No respiratory distress at rest.  Well-nourished well-developed.  HENT:     Head: Normocephalic and atraumatic.     Mouth/Throat:     Pharynx: Oropharynx is clear.  Eyes:     Extraocular Movements: Extraocular movements intact.  Cardiovascular:     Rate and Rhythm: Normal rate and regular rhythm.  Pulmonary:     Comments: No respiratory distress at rest.  Patient does have harsh cough with deep inspiration.  Some crackle and wheeze at the bases. Abdominal:     General: There is no distension.     Palpations: Abdomen is soft.     Tenderness:  There is no abdominal tenderness. There is no guarding.  Musculoskeletal:        General: No swelling or tenderness. Normal range of motion.     Right lower leg: No edema.     Left lower leg: No edema.  Skin:    General: Skin is warm and dry.  Neurological:     General: No focal deficit present.     Mental Status: She is oriented to person, place,  and time.     Coordination: Coordination normal.  Psychiatric:        Mood and Affect: Mood normal.    ED Results / Procedures / Treatments   Labs (all labs ordered are listed, but only abnormal results are displayed) Labs Reviewed  BASIC METABOLIC PANEL - Abnormal; Notable for the following components:      Result Value   Glucose, Bld 122 (*)    All other components within normal limits  BRAIN NATRIURETIC PEPTIDE  CBC    EKG None  Radiology DG Chest Portable 1 View  Result Date: 02/09/2021 CLINICAL DATA:  Cough, shortness of breath, COVID. EXAM: PORTABLE CHEST 1 VIEW COMPARISON:  Chest x-ray 11/16/2020. FINDINGS: The heart is enlarged. There is central pulmonary vascular congestion and there are interstitial opacities in both mid lungs. There is no lung consolidation, pleural effusion or pneumothorax. No acute fractures are seen. IMPRESSION: 1. Cardiomegaly with central pulmonary vascular congestion/mild interstitial edema. Electronically Signed   By: Darliss Cheney M.D.   On: 02/09/2021 17:36    Procedures Procedures   Medications Ordered in ED Medications  albuterol (VENTOLIN HFA) 108 (90 Base) MCG/ACT inhaler 2 puff (2 puffs Inhalation Given 02/09/21 1752)  methylPREDNISolone sodium succinate (SOLU-MEDROL) 125 mg/2 mL injection 125 mg (125 mg Intravenous Given 02/09/21 1840)  ipratropium-albuterol (DUONEB) 0.5-2.5 (3) MG/3ML nebulizer solution 3 mL (3 mLs Nebulization Given 02/09/21 1831)    ED Course  I have reviewed the triage vital signs and the nursing notes.  Pertinent labs & imaging results that were available during my  care of the patient were reviewed by me and considered in my medical decision making (see chart for details).    MDM Rules/Calculators/A&P                           Recheck: After nebulizer treatment and recheck, clear to auscultation without wheeze or crackle.  Blood pressure heart rate and oxygen saturation normal.  Patient presents with paroxysmal harsh coughing.  She does have wheeze on arrival.  Patient has been having problems with cough and bronchitic type symptoms periodically but worse since COVID in July.  Patient is nontoxic.  She does not have fever.  Chest x-ray suggested mild vascular congestion however patient clinically has no symptoms of CHF.  She has no peripheral edema BNP is normal, patient has slight exertional dyspnea but not much as she is still walking into usual activities without limitation.  No chest pain.  At this time we will treat for bronchitis with bronchospasm.  We will provide short course of prednisone and albuterol inhaler.  I recommend close follow-up and recheck with PCP.  Return precautions reviewed Final Clinical Impression(s) / ED Diagnoses Final diagnoses:  Bronchitis    Rx / DC Orders ED Discharge Orders          Ordered    predniSONE (DELTASONE) 20 MG tablet        02/09/21 2121    HYDROcodone bit-homatropine (HYCODAN) 5-1.5 MG/5ML syrup  Every 6 hours PRN        02/09/21 2122             Arby Barrette, MD 02/09/21 2126

## 2021-02-10 ENCOUNTER — Telehealth (HOSPITAL_BASED_OUTPATIENT_CLINIC_OR_DEPARTMENT_OTHER): Payer: Self-pay | Admitting: Student

## 2021-02-10 ENCOUNTER — Other Ambulatory Visit (HOSPITAL_BASED_OUTPATIENT_CLINIC_OR_DEPARTMENT_OTHER): Payer: Self-pay

## 2021-02-10 MED ORDER — PREDNISONE 20 MG PO TABS
ORAL_TABLET | ORAL | 0 refills | Status: DC
  Filled 2021-02-10: qty 8, 4d supply, fill #0

## 2021-02-10 MED ORDER — HYDROCODONE BIT-HOMATROP MBR 5-1.5 MG/5ML PO SOLN
5.0000 mL | Freq: Four times a day (QID) | ORAL | 0 refills | Status: DC | PRN
  Filled 2021-02-10: qty 120, 6d supply, fill #0

## 2021-02-10 NOTE — Telephone Encounter (Signed)
Patient seen in the emergency department last night for bronchitis, was prescribed prednisone and Hycodan cough syrup but could not get this filled at [facility] pharmacy where it was sent. I have represcribed these medications to our [facility] pharmacy and the patient has been notified.

## 2021-04-04 ENCOUNTER — Other Ambulatory Visit: Payer: Self-pay | Admitting: Cardiovascular Disease

## 2021-06-16 ENCOUNTER — Other Ambulatory Visit: Payer: Self-pay | Admitting: Cardiovascular Disease

## 2021-06-16 DIAGNOSIS — E782 Mixed hyperlipidemia: Secondary | ICD-10-CM

## 2021-07-30 ENCOUNTER — Other Ambulatory Visit: Payer: Self-pay | Admitting: Cardiovascular Disease

## 2021-07-30 DIAGNOSIS — E782 Mixed hyperlipidemia: Secondary | ICD-10-CM

## 2021-08-22 ENCOUNTER — Other Ambulatory Visit: Payer: Self-pay | Admitting: Cardiovascular Disease

## 2021-08-22 DIAGNOSIS — E782 Mixed hyperlipidemia: Secondary | ICD-10-CM

## 2021-08-31 ENCOUNTER — Emergency Department (HOSPITAL_BASED_OUTPATIENT_CLINIC_OR_DEPARTMENT_OTHER)
Admission: EM | Admit: 2021-08-31 | Discharge: 2021-08-31 | Disposition: A | Discharge status: Home / Self Care | Payer: No Typology Code available for payment source | Attending: Emergency Medicine | Admitting: Emergency Medicine

## 2021-08-31 ENCOUNTER — Other Ambulatory Visit: Payer: Self-pay

## 2021-08-31 ENCOUNTER — Emergency Department (HOSPITAL_BASED_OUTPATIENT_CLINIC_OR_DEPARTMENT_OTHER): Payer: No Typology Code available for payment source

## 2021-08-31 ENCOUNTER — Encounter (HOSPITAL_BASED_OUTPATIENT_CLINIC_OR_DEPARTMENT_OTHER): Payer: Self-pay | Admitting: Emergency Medicine

## 2021-08-31 DIAGNOSIS — Z7982 Long term (current) use of aspirin: Secondary | ICD-10-CM | POA: Insufficient documentation

## 2021-08-31 DIAGNOSIS — N3001 Acute cystitis with hematuria: Secondary | ICD-10-CM | POA: Diagnosis not present

## 2021-08-31 DIAGNOSIS — M549 Dorsalgia, unspecified: Secondary | ICD-10-CM | POA: Diagnosis present

## 2021-08-31 DIAGNOSIS — Z7902 Long term (current) use of antithrombotics/antiplatelets: Secondary | ICD-10-CM | POA: Diagnosis not present

## 2021-08-31 DIAGNOSIS — N132 Hydronephrosis with renal and ureteral calculous obstruction: Secondary | ICD-10-CM | POA: Insufficient documentation

## 2021-08-31 DIAGNOSIS — N201 Calculus of ureter: Secondary | ICD-10-CM

## 2021-08-31 DIAGNOSIS — E119 Type 2 diabetes mellitus without complications: Secondary | ICD-10-CM | POA: Insufficient documentation

## 2021-08-31 DIAGNOSIS — Z79899 Other long term (current) drug therapy: Secondary | ICD-10-CM | POA: Diagnosis not present

## 2021-08-31 DIAGNOSIS — I10 Essential (primary) hypertension: Secondary | ICD-10-CM | POA: Insufficient documentation

## 2021-08-31 DIAGNOSIS — Z794 Long term (current) use of insulin: Secondary | ICD-10-CM | POA: Diagnosis not present

## 2021-08-31 LAB — CBC WITH DIFFERENTIAL/PLATELET
Abs Immature Granulocytes: 0.04 10*3/uL (ref 0.00–0.07)
Basophils Absolute: 0.1 10*3/uL (ref 0.0–0.1)
Basophils Relative: 0 %
Eosinophils Absolute: 0 10*3/uL (ref 0.0–0.5)
Eosinophils Relative: 0 %
HCT: 38.8 % (ref 36.0–46.0)
Hemoglobin: 12.7 g/dL (ref 12.0–15.0)
Immature Granulocytes: 0 %
Lymphocytes Relative: 10 %
Lymphs Abs: 1.2 10*3/uL (ref 0.7–4.0)
MCH: 28.3 pg (ref 26.0–34.0)
MCHC: 32.7 g/dL (ref 30.0–36.0)
MCV: 86.6 fL (ref 80.0–100.0)
Monocytes Absolute: 0.7 10*3/uL (ref 0.1–1.0)
Monocytes Relative: 6 %
Neutro Abs: 9.5 10*3/uL — ABNORMAL HIGH (ref 1.7–7.7)
Neutrophils Relative %: 84 %
Platelets: 298 10*3/uL (ref 150–400)
RBC: 4.48 MIL/uL (ref 3.87–5.11)
RDW: 14.4 % (ref 11.5–15.5)
WBC: 11.5 10*3/uL — ABNORMAL HIGH (ref 4.0–10.5)
nRBC: 0 % (ref 0.0–0.2)

## 2021-08-31 LAB — URINALYSIS, ROUTINE W REFLEX MICROSCOPIC
Bilirubin Urine: NEGATIVE
Glucose, UA: >=500 mg/dL — AB
Ketones, ur: NEGATIVE mg/dL
Leukocytes,Ua: NEGATIVE
Nitrite: NEGATIVE
Protein, ur: 100 mg/dL — AB
Specific Gravity, Urine: 1.025 (ref 1.005–1.030)
pH: 7 (ref 5.0–8.0)

## 2021-08-31 LAB — URINALYSIS, MICROSCOPIC (REFLEX): RBC / HPF: >50 RBC/hpf (ref 0–5)

## 2021-08-31 LAB — BASIC METABOLIC PANEL
Anion gap: 8 (ref 5–15)
BUN: 20 mg/dL (ref 8–23)
CO2: 22 mmol/L (ref 22–32)
Calcium: 11 mg/dL — ABNORMAL HIGH (ref 8.9–10.3)
Chloride: 110 mmol/L (ref 98–111)
Creatinine, Ser: 1.4 mg/dL — ABNORMAL HIGH (ref 0.44–1.00)
GFR, Estimated: 41 mL/min — ABNORMAL LOW (ref >60)
Glucose, Bld: 87 mg/dL (ref 70–99)
Potassium: 4.7 mmol/L (ref 3.5–5.1)
Sodium: 140 mmol/L (ref 135–145)

## 2021-08-31 MED ORDER — ONDANSETRON HCL 4 MG/2ML IJ SOLN
4.0000 mg | Freq: Once | INTRAMUSCULAR | Status: AC
  Administered 2021-08-31: 4 mg via INTRAVENOUS
  Filled 2021-08-31: qty 2

## 2021-08-31 MED ORDER — CEPHALEXIN 500 MG PO CAPS: 500.0000 mg | ORAL_CAPSULE | Freq: Four times a day (QID) | ORAL | 0 refills | Status: DC

## 2021-08-31 MED ORDER — HYDROMORPHONE HCL 1 MG/ML IJ SOLN
0.5000 mg | Freq: Once | INTRAMUSCULAR | Status: AC
  Administered 2021-08-31: 0.5 mg via INTRAVENOUS
  Filled 2021-08-31: qty 1

## 2021-08-31 NOTE — ED Provider Notes (Signed)
?[facility] EMERGENCY DEPARTMENT ?Provider Note ? ? ?CSN: [id number] ?Arrival date & time: 08/31/21  1639 ? ?  ? ?History ? ?Chief Complaint  ?Patient presents with  ? Back Pain  ? ? ?Danielle Fisher is a 66 y.o. female. ? ?Patient with sudden onset of right sided back pain and flank pain and urinary frequency at about 2 in the morning.  Patient felt fine all day yesterday.  No nausea no vomiting no fevers.  Does have a little bit of dizziness.  Patient was started on Jardiance about a month ago for her type 2 diabetes.  Patient also does have a history of kidney stones. ? ?Past medical history significant for hypertension kidney stones hyperlipidemia type 2 diabetes gastroesophageal reflux disease arthritis coronary artery disease thyromegaly and obesity.  Past surgical history significant for cardiac catheterization in 2016. ? ? ?  ? ?Home Medications ?Prior to Admission medications   ?Medication Sig Start Date End Date Taking? Authorizing Provider  ?cephALEXin (KEFLEX) 500 MG capsule Take 1 capsule (500 mg total) by mouth 4 (four) times daily. 08/31/21  Yes Vanetta Mulders, MD  ?amLODipine (NORVASC) 10 MG tablet Take 10 mg by mouth daily.    [provider]  ?aspirin EC 81 MG tablet Take 81 mg by mouth daily.    [provider]  ?carvedilol (COREG) 25 MG tablet TAKE 1 TABLET TWICE DAILY 04/05/21   Nahser, [name], MD  ?clopidogrel (PLAVIX) 75 MG tablet Take 1 tablet (75 mg total) by mouth daily. 04/24/18   Nahser, [name], MD  ?glucose blood (ONETOUCH ULTRA) test strip 1 each by Other route as needed. 11/01/17   [provider]  ?hydrALAZINE (APRESOLINE) 25 MG tablet Take 1 tablet (25 mg total) by mouth 3 (three) times daily. 11/19/20 12/19/20  Hughie Closs, MD  ?HYDROcodone bit-homatropine (HYCODAN) 5-1.5 MG/5ML syrup Take 5 mLs by mouth every 6 (six) hours as needed for cough. 02/10/21   Dartha Lodge, PA-C  ?insulin glargine (LANTUS) 100 UNIT/ML injection Inject 30 Units  into the skin daily.    [provider]  ?nitroGLYCERIN (NITROSTAT) 0.4 MG SL tablet Place 1 tablet (0.4 mg total) under the tongue every 5 (five) minutes as needed for chest pain (up to 3 doses). 04/22/15   Dunn, Tacey Ruiz, PA-C  ?predniSONE (DELTASONE) 20 MG tablet Take 2 tablets by mouth daily for 4 days 02/10/21   Dartha Lodge, PA-C  ?rosuvastatin (CRESTOR) 40 MG tablet TAKE 1 TABLET EVERY DAY (NEED MD APPOINTMENT FOR REFILLS) 08/23/21   Nahser, [name], MD  ?Dorice Lamas, 1 MG/DOSE, [place] Inject 1 mg into the skin every Sunday.    [provider]  ?   ? ?Allergies    ?Lisinopril, Atorvastatin, and Simvastatin   ? ?Review of Systems   ?Review of Systems  ?Constitutional:  Negative for chills and fever.  ?HENT:  Negative for ear pain and sore throat.   ?Eyes:  Negative for pain and visual disturbance.  ?Respiratory:  Negative for cough and shortness of breath.   ?Cardiovascular:  Negative for chest pain and palpitations.  ?Gastrointestinal:  Positive for abdominal pain. Negative for nausea and vomiting.  ?Genitourinary:  Positive for flank pain and frequency. Negative for dysuria and hematuria.  ?Musculoskeletal:  Positive for back pain. Negative for arthralgias.  ?Skin:  Negative for color change and rash.  ?Neurological:  Negative for seizures and syncope.  ?All other systems reviewed and are negative. ? ?Physical Exam ?Updated Vital Signs ?BP  134/63 (BP Location: Right Arm)   Pulse 100   Temp 98.3 ?F (36.8 ?C) (Oral)   Resp 18   Ht 1.676 m (5\' 6" )   Wt 99.8 kg   SpO2 96%   BMI 35.51 kg/m?  ?Physical Exam ?Vitals and nursing note reviewed.  ?Constitutional:   ?   General: She is not in acute distress. ?   Appearance: Normal appearance. She is well-developed.  ?HENT:  ?   Head: Normocephalic and atraumatic.  ?Eyes:  ?   Extraocular Movements: Extraocular movements intact.  ?   Conjunctiva/sclera: Conjunctivae normal.  ?   Pupils: Pupils are equal, round, and reactive to light.   ?Cardiovascular:  ?   Rate and Rhythm: Normal rate and regular rhythm.  ?   Heart sounds: No murmur heard. ?Pulmonary:  ?   Effort: Pulmonary effort is normal. No respiratory distress.  ?   Breath sounds: Normal breath sounds.  ?Abdominal:  ?   Palpations: Abdomen is soft.  ?   Tenderness: There is no abdominal tenderness.  ?Musculoskeletal:     ?   General: No swelling.  ?   Cervical back: Normal range of motion and neck supple.  ?Skin: ?   General: Skin is warm and dry.  ?   Capillary Refill: Capillary refill takes less than 2 seconds.  ?Neurological:  ?   General: No focal deficit present.  ?   Mental Status: She is alert and oriented to person, place, and time.  ?   Cranial Nerves: No cranial nerve deficit.  ?   Sensory: No sensory deficit.  ?   Motor: No weakness.  ?Psychiatric:     ?   Mood and Affect: Mood normal.  ? ? ?ED Results / Procedures / Treatments   ?Labs ?(all labs ordered are listed, but only abnormal results are displayed) ?Labs Reviewed  ?URINALYSIS, ROUTINE W REFLEX MICROSCOPIC - Abnormal; Notable for the following components:  ?    Result Value  ? APPearance HAZY (*)   ? Glucose, UA >=500 (*)   ? Hgb urine dipstick LARGE (*)   ? Protein, ur 100 (*)   ? All other components within normal limits  ?CBC WITH DIFFERENTIAL/PLATELET - Abnormal; Notable for the following components:  ? WBC 11.5 (*)   ? Neutro Abs 9.5 (*)   ? All other components within normal limits  ?BASIC METABOLIC PANEL - Abnormal; Notable for the following components:  ? Creatinine, Ser 1.40 (*)   ? Calcium 11.0 (*)   ? GFR, Estimated 41 (*)   ? All other components within normal limits  ?URINALYSIS, MICROSCOPIC (REFLEX) - Abnormal; Notable for the following components:  ? Bacteria, UA MANY (*)   ? All other components within normal limits  ?URINE CULTURE  ? ? ?EKG ?None ? ?Radiology ?CT Renal Stone Study ? ?Result Date: 08/31/2021 ?CLINICAL DATA:  Flank pain, kidney stone suspected Patient reports right-sided back pain frequent  urination. EXAM: CT ABDOMEN AND PELVIS WITHOUT CONTRAST TECHNIQUE: Multidetector CT imaging of the abdomen and pelvis was performed following the standard protocol without IV contrast. RADIATION DOSE REDUCTION: This exam was performed according to the departmental dose-optimization program which includes automated exposure control, adjustment of the mA and/or kV according to patient size and/or use of iterative reconstruction technique. COMPARISON:  CT 04/11/2021, 11/15/2020 FINDINGS: Lower chest: No confluent consolidation or pleural effusion. There are coronary artery calcifications. Hepatobiliary: No focal liver abnormality is seen. No gallstones, gallbladder wall thickening, or biliary dilatation.  Pancreas: No ductal dilatation or inflammation. Spleen: Normal in size without focal abnormality. Adrenals/Urinary Tract: Normal adrenal glands moderate right hydroureteronephrosis. There is a 9 mm stone in the midline urinary bladder that likely represents a recently passed stone. No additional right ureteral calculi. Mild right perinephric edema. No intrarenal calculi. There is no left hydronephrosis. Calcifications at the left renal hilum may represent combination of nonobstructing stones and vascular calcifications. There is a 3.6 cm lesion arising from the posterior left kidney with Hounsfield units 21. This is stable from prior CT, and represented cyst on 11/17/2020 ultrasound. 9 mm stone in the urinary bladder. No bladder wall thickening. Stomach/Bowel: Small hiatal hernia. Ingested material and or fluid in the stomach. There is no small bowel obstruction or inflammation. Normal appendix. Moderate colonic stool burden. No colonic inflammation. Vascular/Lymphatic: Moderate aortic atherosclerosis. No aortic aneurysm. No portal venous or mesenteric gas. No bulky abdominopelvic adenopathy. Reproductive: Quiescent uterus.  No adnexal mass. Other: No free air or ascites.  No abdominal wall hernia. Musculoskeletal:  L5-S1 and L4-L5 degenerative disc disease. There are no acute or suspicious osseous abnormalities. IMPRESSION: 1. Moderate right hydroureteronephrosis with a 9 mm stone in the midline urinary bladder that likely represents a rec

## 2021-08-31 NOTE — Discharge Instructions (Addendum)
CT shows passage of the stone on the right side that was 9 mm into the bladder.  This is reassuring.  Questionable infection secondary to this.  But may just be due to the stone.  But will treat with the antibiotic Keflex for the next few days.  Follow-up with urology.  Dr. Retta Diones with [facility] information provided.  Or follow-up through the [place] system.  Return for any new or worse symptoms.  Urine also was sent for culture.  Which will confirm whether there is signs of an infection or not.  In addition your renal function was off some and that needs to be followed up to make sure it goes back to normal. ?

## 2021-08-31 NOTE — ED Notes (Signed)
Patient transported to CT 

## 2021-08-31 NOTE — ED Triage Notes (Addendum)
Freq urination , back pain ,  since last night ,just put on Jardenence 1 month ago  ?

## 2021-08-31 NOTE — ED Notes (Signed)
ED Provider at bedside to review results 

## 2021-09-02 LAB — URINE CULTURE

## 2021-12-20 ENCOUNTER — Other Ambulatory Visit: Payer: Self-pay | Admitting: Cardiovascular Disease

## 2021-12-20 DIAGNOSIS — E782 Mixed hyperlipidemia: Secondary | ICD-10-CM

## 2022-01-04 ENCOUNTER — Other Ambulatory Visit: Payer: Self-pay | Admitting: Cardiovascular Disease

## 2022-01-04 DIAGNOSIS — E782 Mixed hyperlipidemia: Secondary | ICD-10-CM

## 2022-01-16 ENCOUNTER — Encounter: Payer: Self-pay | Admitting: Cardiovascular Disease

## 2022-01-16 ENCOUNTER — Ambulatory Visit: Payer: Medicare HMO | Attending: Cardiovascular Disease | Admitting: Cardiovascular Disease

## 2022-01-16 DIAGNOSIS — E782 Mixed hyperlipidemia: Secondary | ICD-10-CM | POA: Diagnosis not present

## 2022-01-16 MED ORDER — ROSUVASTATIN CALCIUM 40 MG PO TABS: 40.0000 mg | ORAL_TABLET | Freq: Every day | ORAL | 3 refills | Status: DC

## 2022-01-16 NOTE — Progress Notes (Signed)
Cardiology Office Note Date:  01/16/2022  Patient ID:  Danielle Fisher, Danielle Fisher [birth year], MRN [id number] PCP:  Cl[facility], [facility]  Cardiologist:  Ainslie Mazurek  Problem list 1. Coronary artery disease-status post stenting of the distal RCA 2. Hyperlipidemia 3. Essential hypertension 4. Diabetes mellitus  Chief Complaint: f/u NSTEMI    Danielle Fisher is a 66 y.o. female with history of HTN, HLD, DM, recently diagnosed CAD (NSTEMI 04/2015 s/p s/p DES to distal RCA; residual moderate ostial rPDA but vessel overall small; EF 55-65%) who presents for post-hospital follow-up. She had recently been evaluated at the [facility] for chest pain which was deemed possibly related to acid reflux. Outpatient echo had been planned for "enlarged heart" on imaging. In the interim she presented to [facility] for symptoms and ruled in for NSTEMI with subsequent PCI as above on 04/21/15. She was started on ASA/Brilinta. During her hospital stay she was noted to have high blood pressure and ARB was added. Crestor was titrated further.   She comes in for f/u overall doing well. She does notice persistent fatigue that was present even before MI. No CP or SOB. She does wake herself up snoring from time to time. She has never been evaluated for OSA. She is awaiting word back from cardiac rehab in [facility] - interested in participating in their program. BP remains elevated. She did take her medicines already today around 6:30am. Recheck BP in clinic by me was 154/82.  Sep 24, 2015: Primary MD is at the [facility] is a 68 yo who I met in Dec, 2016 with UAP. Cath revealed a tight right coronary stenosis. 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  She's done well since that time. She's not had any further episodes of chest pain or shortness of breath.  She has occasional palpitations that last for perhaps a few seconds. She saw her primary medical doctor at the [place] last week. She was noted have some mild locations of  her liver enzymes. She had a GI workup.   Dec. 6, 2017:  Doing well.  No angian  Checks BP at home.   BP fluctuates.   Is generally better than last year.   Is watching her salt .   Sept. 10, 2018"  Danielle Fisher is doing well.  No CP or dyspnea.  Getting some exercise  Recent lab work in the [place] Chol = 131 Trigs = 94 HDL = 45 LDL = 67   HbA1C was 9.5   Sep 21, 2017:  Danielle Fisher is doing well.   No further CP. HbA1C is now 7.5.  Sees Dr. Tamala Julian at [facility] center  Is exercising , is watching her diet  Had some vertigo  - has mostly resolved.   July 09, 2019:  Danielle Fisher is seen today for follow-up visit.   She has a history of coronary artery disease with a non-ST segment elevation and stenting of the right coronary artery in 2016.  She was last seen in May, 2019.  She has a history of diabetes.  Her hemoglobin A1c has been elevated. Wt today is 216 Exercising  Some.   Labs at the [place] recently .   We do not have access to those   July 09, 2020: Danielle Fisher is seen today for a follow-up visit.  She has a history of coronary artery disease.  She has a non-ST segment elevation myocardial infarction and had stenting of the right coronary artery in 2016.  She has a history  of diabetes mellitus.  She has a history of hyperlipidemia.  No CP or dyspnea . Is getting some exercise, walks several times a week  Wt is 199 lbs.   Down 17 lbs from last year  Her lipids were fairly well controlled last year.  She was seen by her [place] doctor and her rosuvastatin was stopped apparently for elevated liver enzymes.  I do not have any of those labs.  I have asked Wynona to bring her labs to show [place] in the next week or so.  I strongly think that she needs to restart a PCSK9 inhibitor if she is indeed intolerant to rosuvastatin.  Another option would be Inclisiran    January 16, 2022: Danielle Fisher is seen today for follow-up of her coronary artery disease.  She has a history of a non-ST segment  elevation myocardial infarction in 2016.  History of diabetes mellitus and hyperlipidemia. Wt is 189 lbs (  down 10 lbs )  Working out, eating lbs     Past Medical History:  Diagnosis Date   Arthritis    "legs, arms" (04/20/2015)   CAD (coronary artery disease)    a. NSTEMI -> LHC 04/21/15: s/p DES to distal RCA; residual moderate ostial rPDA but vessel is overall small; EF 55-65%.   GERD (gastroesophageal reflux disease)    Hyperlipidemia    Hypertension    Kidney stones    Obesity    Thyromegaly 12/21/2012   See CT chest 8/13 /14     Type II diabetes mellitus ([place])    Vitamin D deficiency 11/06/2016    Past Surgical History:  Procedure Laterality Date   CARDIAC CATHETERIZATION N/A 04/21/2015   Procedure: Left Heart Cath and Coronary Angiography;  Surgeon: Wellington Hampshire, MD;  Location: [facility];  Service: Cardiovascular;  Laterality: N/A;   CARDIAC CATHETERIZATION N/A 04/21/2015   Procedure: Coronary Stent Intervention;  Surgeon: Wellington Hampshire, MD;  Location: [facility];  Service: Cardiovascular;  Laterality: N/A;   CESAREAN SECTION  1980   DILATION AND CURETTAGE OF UTERUS  1980's X 1    Current Outpatient Medications  Medication Sig Dispense Refill   amLODipine (NORVASC) 10 MG tablet Take 10 mg by mouth daily.     aspirin EC 81 MG tablet Take 81 mg by mouth daily.     carvedilol (COREG) 25 MG tablet TAKE 1 TABLET TWICE DAILY 180 tablet 1   clopidogrel (PLAVIX) 75 MG tablet Take 1 tablet (75 mg total) by mouth daily. 90 tablet 3   glucose blood (ONETOUCH ULTRA) test strip 1 each by Other route as needed.     insulin glargine (LANTUS) 100 UNIT/ML injection Inject 30 Units into the skin daily.     JARDIANCE 10 MG TABS tablet Take 10 mg by mouth daily.     losartan (COZAAR) 25 MG tablet Take 25 mg by mouth daily.     nitroGLYCERIN (NITROSTAT) 0.4 MG SL tablet Place 1 tablet (0.4 mg total) under the tongue every 5 (five) minutes as needed for chest pain  (up to 3 doses). 25 tablet 3   SEMAGLUTIDE, 1 MG/DOSE,  Inject 1 mg into the skin every Sunday.     Vitamin D, Ergocalciferol, (DRISDOL) 1.25 MG (50000 UNIT) CAPS capsule Take 50,000 Units by mouth once a week.     rosuvastatin (CRESTOR) 40 MG tablet Take 1 tablet (40 mg total) by mouth daily. 90 tablet 3   No current facility-administered medications for this  visit.    Allergies:   Patient has no active allergies.   Social History:  The patient  reports that she has never smoked. She has never been exposed to tobacco smoke. She has never used smokeless tobacco. She reports that she does not drink alcohol and does not use drugs.   Family History:  The patient's family history includes Breast cancer in her sister; Diabetes in her father, sister, and sister; Heart disease in her mother; Stroke in her father and mother.  ROS:  Noted in current hx   Physical Exam: Blood pressure 138/74, pulse (!) 53, height _0  (1.702 m), weight 189 lb 6.4 oz (85.9 kg), SpO2 97 %.       GEN:  Well nourished, well developed in no acute distress HEENT: Normal NECK: No JVD; No carotid bruits LYMPHATICS: No lymphadenopathy CARDIAC: RRR , no murmurs, rubs, gallops RESPIRATORY:  Clear to auscultation without rales, wheezing or rhonchi  ABDOMEN: Soft, non-tender, non-distended MUSCULOSKELETAL:  No edema; No deformity  SKIN: Warm and dry NEUROLOGIC:  Alert and oriented x 3   EKG:   Sept. 11, 2023   .  Sinus brady at 53.  Moderate voltage for LVH    Recent Labs: 02/09/2021: B Natriuretic Peptide 5.2 08/31/2021: BUN 20; Creatinine, Ser 1.40; Hemoglobin 12.7; Platelets 298; Potassium 4.7; Sodium 140  No results found for requested labs within last 365 days.   CrCl cannot be calculated (Patient's most recent lab result is older than the maximum 21 days allowed.).   Wt Readings from Last 3 Encounters:  01/16/22 189 lb 6.4 oz (85.9 kg)  08/31/21 220 lb (99.8 kg)  02/09/21 200 lb (90.7 kg)     Other  studies reviewed: Additional studies/records reviewed today include: summarized above  ASSESSMENT AND PLAN:  CAD with recent NSTEMI -no symptoms of angina.  Continue current medications.       2.  Essential HTN  : Blood pressure is well controlled.   3.   HLD : Right her LDL last year was 2 which was very close to goal.  Now her LDL goal is 55.  We will draw lipids, ALT, basic metabolic profile today.  If her current level is not close to 55 we will consider adding Zetia to her rosuvastatin.   4.  History of acute renal insufficiency: She had acute renal insufficiency last year with a creatinine of around 4.  She was hospitalized for about a week and now her creatinine seems to be stable.     Mertie Moores, MD  01/16/2022 3:19 PM    [address],  [address] [place], [place]  [address] Pager [phone] Phone: [phone]; Fax: [phone]

## 2022-01-16 NOTE — Patient Instructions (Signed)
Medication Instructions:  Your physician recommends that you continue on your current medications as directed. Please refer to the Current Medication list given to you today.  *If you need a refill on your cardiac medications before your next appointment, please call your pharmacy*   Lab Work: Bmet, Lipids, ALP If you have labs (blood work) drawn today and your tests are completely normal, you will receive your results only by: MyChart Message (if you have MyChart) OR A paper copy in the mail If you have any lab test that is abnormal or we need to change your treatment, we will call you to review the results.   Follow-Up: At [facility], you and your health needs are our priority.  As part of our continuing mission to provide you with exceptional heart care, we have created designated Provider Care Teams.  These Care Teams include your primary Cardiologist (physician) and Advanced Practice Providers (APPs -  Physician Assistants and Nurse Practitioners) who all work together to provide you with the care you need, when you need it.   Your next appointment:   12 month(s)  The format for your next appointment:   In Person  Provider:   Kristeen Miss, MD

## 2022-01-17 LAB — BASIC METABOLIC PANEL
BUN/Creatinine Ratio: 12 (ref 12–28)
BUN: 17 mg/dL (ref 8–27)
CO2: 22 mmol/L (ref 20–29)
Calcium: 11.1 mg/dL — ABNORMAL HIGH (ref 8.7–10.3)
Chloride: 109 mmol/L — ABNORMAL HIGH (ref 96–106)
Creatinine, Ser: 1.44 mg/dL — ABNORMAL HIGH (ref 0.57–1.00)
Glucose: 110 mg/dL — ABNORMAL HIGH (ref 70–99)
Potassium: 4.2 mmol/L (ref 3.5–5.2)
Sodium: 144 mmol/L (ref 134–144)
eGFR: 40 mL/min/{1.73_m2} — ABNORMAL LOW (ref >59)

## 2022-01-17 LAB — LIPID PANEL
Chol/HDL Ratio: 2.5 ratio (ref 0.0–4.4)
Cholesterol, Total: 123 mg/dL (ref 100–199)
HDL: 50 mg/dL (ref >39)
LDL Chol Calc (NIH): 61 mg/dL (ref 0–99)
Triglycerides: 52 mg/dL (ref 0–149)
VLDL Cholesterol Cal: 12 mg/dL (ref 5–40)

## 2022-01-17 LAB — ALT: ALT: 71 IU/L — ABNORMAL HIGH (ref 0–32)

## 2022-01-20 ENCOUNTER — Telehealth: Payer: Self-pay

## 2022-01-20 DIAGNOSIS — E782 Mixed hyperlipidemia: Secondary | ICD-10-CM

## 2022-01-20 DIAGNOSIS — R7989 Other specified abnormal findings of blood chemistry: Secondary | ICD-10-CM

## 2022-01-20 MED ORDER — ROSUVASTATIN CALCIUM 20 MG PO TABS: 20.0000 mg | ORAL_TABLET | Freq: Every day | ORAL | 3 refills | Status: DC

## 2022-01-20 NOTE — Telephone Encounter (Signed)
-----   Message from Vesta Mixer, MD sent at 01/19/2022  5:57 PM EDT ----- Basic metabolic profile reveals mildly elevated creatinine which is much better than it was a year ago. Glucose is mildly elevated. ALT is 71 which is 2 times normal limit. I would suggest decreasing her rosuvastatin to 20 mg a day and rechecking labs.  We can add Zetia 10 mg a day if the rosuvastatin 20 mg does not get her at goal.  Recheck lipids and ALT in 2 months.

## 2022-01-20 NOTE — Telephone Encounter (Signed)
Spoke with patient who understands the need to decrease Rosuvastatin to 20mg  daily. Rx was sent to [facility] yesterday for 40mg  tabs. States if she gets these, she will cut tablets in half. New rx sent in today for 20mg  tabs. Order placed for repeat Lipids, ALT and scheduled for 03/24/22.

## 2022-01-23 ENCOUNTER — Telehealth: Payer: Self-pay | Admitting: Cardiovascular Disease

## 2022-01-23 ENCOUNTER — Other Ambulatory Visit: Payer: Self-pay | Admitting: *Deleted

## 2022-01-23 DIAGNOSIS — R7989 Other specified abnormal findings of blood chemistry: Secondary | ICD-10-CM

## 2022-01-23 DIAGNOSIS — E782 Mixed hyperlipidemia: Secondary | ICD-10-CM

## 2022-01-23 MED ORDER — ROSUVASTATIN CALCIUM 20 MG PO TABS: 20.0000 mg | ORAL_TABLET | Freq: Every day | ORAL | 3 refills | Status: DC

## 2022-01-23 NOTE — Telephone Encounter (Signed)
Pt c/o medication issue:  1. Name of Medication:   rosuvastatin (CRESTOR) 20 MG tablet    2. How are you currently taking this medication (dosage and times per day)? Take 1 tablet (20 mg total) by mouth daily. - Oral  3. Are you having a reaction (difficulty breathing--STAT)?   4. What is your medication issue? Pt states she received a 90 day supply for 40 mg on this medication after her last visit and she states the pills are too big for her to cut in half to 20mg . Please advise.

## 2022-01-23 NOTE — Telephone Encounter (Signed)
Left detailed message per DPR letting pt know that she can take these into a local pharmacy and request that they be split and this is usually done free whether she filled her medication there or not. Also let her know, that a prescription for the 20mg  tabs has been sent to [facility] (01/20/22 and again on 01/23/22) asking that they cancel the 40mg  tabs and fill these.

## 2022-01-24 NOTE — Telephone Encounter (Signed)
Pt called in and stated that [facility] will not cut the pills for her.  They told her that since she did not purchase them there, they could not cut them.  She stated she was not able to cut them her self either.  She would like to know what she needs to do now?    Best number [phone]

## 2022-01-24 NOTE — Telephone Encounter (Signed)
Patient called back to see if medication was sent to [facility]. Patient states she is out of the medication. Please advise

## 2022-01-25 NOTE — Telephone Encounter (Signed)
Returned call to patient to inform her that we have sent two rx's into [facility] for the 20mg  tablets. She is going to call them to see if they will fill these instead of her trying to cut the 40mg  tablets. Advised if she cannot get anywhere with them to call us back and let us know.

## 2022-03-24 ENCOUNTER — Ambulatory Visit: Payer: Medicare HMO | Attending: Cardiovascular Disease

## 2022-03-24 DIAGNOSIS — R7989 Other specified abnormal findings of blood chemistry: Secondary | ICD-10-CM

## 2022-03-24 DIAGNOSIS — E782 Mixed hyperlipidemia: Secondary | ICD-10-CM

## 2022-03-25 LAB — LIPID PANEL
Chol/HDL Ratio: 2.3 ratio (ref 0.0–4.4)
Cholesterol, Total: 161 mg/dL (ref 100–199)
HDL: 70 mg/dL (ref >39)
LDL Chol Calc (NIH): 79 mg/dL (ref 0–99)
Triglycerides: 57 mg/dL (ref 0–149)
VLDL Cholesterol Cal: 12 mg/dL (ref 5–40)

## 2022-03-25 LAB — ALT: ALT: 37 IU/L — ABNORMAL HIGH (ref 0–32)

## 2022-03-28 ENCOUNTER — Telehealth: Payer: Self-pay | Admitting: Cardiovascular Disease

## 2022-03-28 DIAGNOSIS — Z79899 Other long term (current) drug therapy: Secondary | ICD-10-CM

## 2022-03-28 MED ORDER — EZETIMIBE 10 MG PO TABS: 10.0000 mg | ORAL_TABLET | Freq: Every day | ORAL | 3 refills | Status: DC

## 2022-03-28 NOTE — Telephone Encounter (Signed)
Patient agrees to begin Zetia. Expressed dislike of taking yet another medication. Educated her on option of PCSK9i's but she states she doesn't want to try to start another shot at this time. Requests medication be sent to [facility] pharmacy. Repeat labs ordered and scheduled for 06/28/22.

## 2022-03-28 NOTE — Telephone Encounter (Signed)
-----   Message from Vesta Mixer, MD sent at 03/26/2022  7:43 AM EST ----- LDL is 79.  Her goal LDL is now 55 Lets add Zetia 10 mg a day  Check lipids, BMP in 3 months

## 2022-06-28 ENCOUNTER — Ambulatory Visit: Payer: No Typology Code available for payment source | Attending: Internal Medicine

## 2022-06-28 DIAGNOSIS — Z79899 Other long term (current) drug therapy: Secondary | ICD-10-CM

## 2022-06-28 LAB — BASIC METABOLIC PANEL
BUN/Creatinine Ratio: 16 (ref 12–28)
BUN: 14 mg/dL (ref 8–27)
CO2: 23 mmol/L (ref 20–29)
Calcium: 11.2 mg/dL — ABNORMAL HIGH (ref 8.7–10.3)
Chloride: 107 mmol/L — ABNORMAL HIGH (ref 96–106)
Creatinine, Ser: 0.88 mg/dL (ref 0.57–1.00)
Glucose: 94 mg/dL (ref 70–99)
Potassium: 4.8 mmol/L (ref 3.5–5.2)
Sodium: 143 mmol/L (ref 134–144)
eGFR: 72 mL/min/{1.73_m2} (ref >59)

## 2022-06-28 LAB — LIPID PANEL
Chol/HDL Ratio: 2.4 ratio (ref 0.0–4.4)
Cholesterol, Total: 120 mg/dL (ref 100–199)
HDL: 50 mg/dL (ref >39)
LDL Chol Calc (NIH): 57 mg/dL (ref 0–99)
Triglycerides: 58 mg/dL (ref 0–149)
VLDL Cholesterol Cal: 13 mg/dL (ref 5–40)

## 2022-11-11 ENCOUNTER — Other Ambulatory Visit: Payer: Self-pay | Admitting: Cardiovascular Disease

## 2022-11-11 DIAGNOSIS — E782 Mixed hyperlipidemia: Secondary | ICD-10-CM

## 2022-11-11 DIAGNOSIS — R7989 Other specified abnormal findings of blood chemistry: Secondary | ICD-10-CM

## 2022-12-04 IMAGING — DX DG CHEST 1V PORT
1 series · 1 of 1 positions shown · non-contrast
Comparison: Radiograph 04/20/2015, CT 12/18/2012

CLINICAL DATA: COVID

EXAM:
PORTABLE CHEST 1 VIEW

[chest ap]
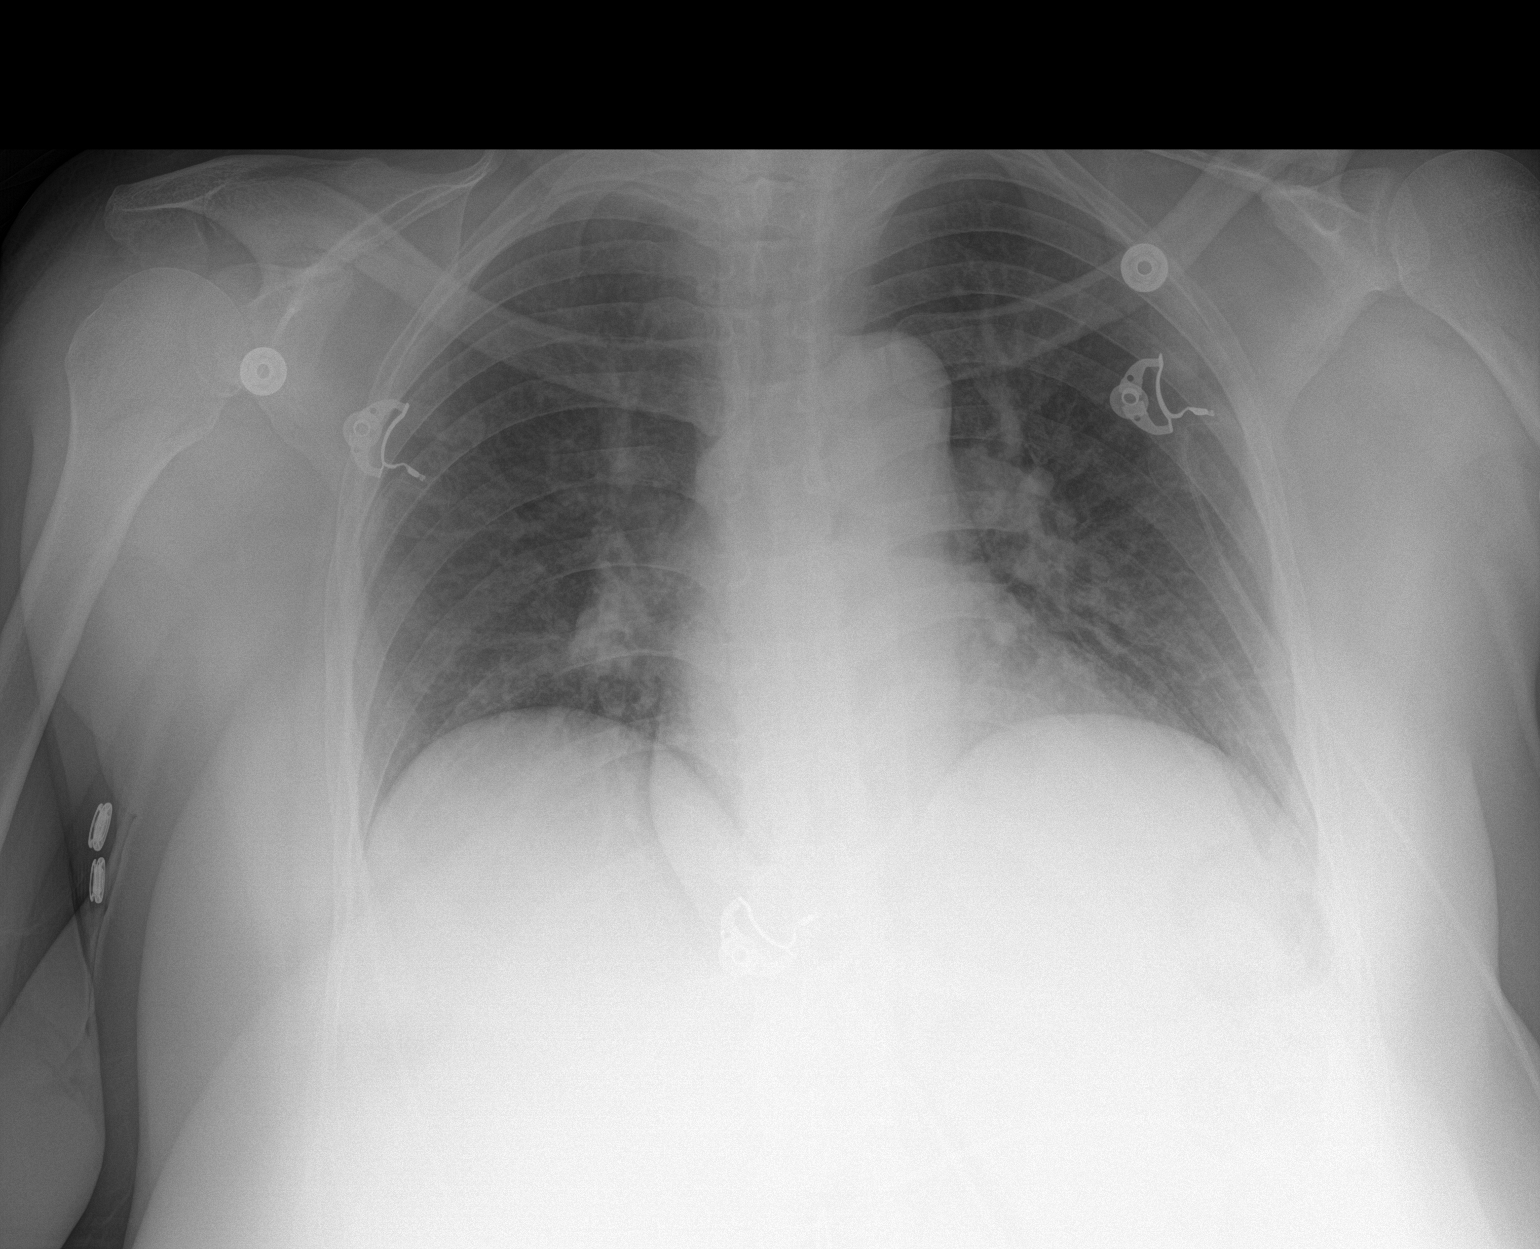

[1 of 1 positions shown; findings below may reference images not displayed]

FINDINGS: Low lung volumes. Streaky and ill-defined patchy opacities are
present in the mid to lower lungs which may reflect further volume
loss or developing airspace opacity in the setting of COVID 19. No
pneumothorax or visible effusion. Cardiomediastinal contours are
unremarkable for the portable technique. No acute osseous or chest
wall abnormality.
IMPRESSION: Low lung volumes.

Streaky and patchy opacities in the lung bases may reflect
atelectasis or developing airspace disease in the setting of
[id number].

## 2023-01-23 ENCOUNTER — Other Ambulatory Visit: Payer: Self-pay | Admitting: Cardiovascular Disease

## 2023-01-23 DIAGNOSIS — Z79899 Other long term (current) drug therapy: Secondary | ICD-10-CM

## 2023-01-25 ENCOUNTER — Other Ambulatory Visit: Payer: Self-pay | Admitting: Cardiovascular Disease

## 2023-01-25 DIAGNOSIS — E782 Mixed hyperlipidemia: Secondary | ICD-10-CM

## 2023-01-25 DIAGNOSIS — R7989 Other specified abnormal findings of blood chemistry: Secondary | ICD-10-CM

## 2023-02-22 ENCOUNTER — Encounter: Payer: Self-pay | Admitting: Cardiovascular Disease

## 2023-02-22 NOTE — Progress Notes (Signed)
Cardiology Office Note Date:  02/22/2023  Patient ID:  Danielle Fisher 08-Mar-1956, MRN [id number] PCP:  [facility]  Cardiologist:  Limuel Nieblas  Problem list 1. Coronary artery disease-status post stenting of the distal RCA 2. Hyperlipidemia 3. Essential hypertension 4. Diabetes mellitus  Chief Complaint: f/u NSTEMI    Danielle Fisher is a 67 y.o. female with history of HTN, HLD, DM, recently diagnosed CAD (NSTEMI 04/2015 s/p s/p DES to distal RCA; residual moderate ostial rPDA but vessel overall small; EF 55-65%) who presents for post-hospital follow-up. She had recently been evaluated at the [facility] for chest pain which was deemed possibly related to acid reflux. Outpatient echo had been planned for "enlarged heart" on imaging. In the interim she presented to [facility] for symptoms and ruled in for NSTEMI with subsequent PCI as above on 04/21/15. She was started on ASA/Brilinta. During her hospital stay she was noted to have high blood pressure and ARB was added. Crestor was titrated further.   She comes in for f/u overall doing well. She does notice persistent fatigue that was present even before MI. No CP or SOB. She does wake herself up snoring from time to time. She has never been evaluated for OSA. She is awaiting word back from cardiac rehab in [facility] - interested in participating in their program. BP remains elevated. She did take her medicines already today around 6:30am. Recheck BP in clinic by me was 154/82.  Sep 24, 2015: Primary MD is at the [place]   Danielle Fisher is a 71 yo who I met in Dec, 2016 with UAP. Cath revealed a tight right coronary stenosis. 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  She's done well since that time. She's not had any further episodes of chest pain or shortness of breath.  She has occasional palpitations that last for perhaps a few seconds. She saw her primary medical doctor at the [place] last week. She was noted have some mild locations of  her liver enzymes. She had a GI workup.   Dec. 6, 2017:  Doing well.  No angian  Checks BP at home.   BP fluctuates.   Is generally better than last year.   Is watching her salt .   Sept. 10, 2018"  Danielle Fisher is doing well.  No CP or dyspnea.  Getting some exercise  Recent lab work in the [place] Chol = 131 Trigs = 94 HDL = 45 LDL = 67   HbA1C was 9.5   Sep 21, 2017:  Danielle Fisher is doing well.   No further CP. HbA1C is now 7.5.  Sees Dr. Katrinka Blazing at [facility] center  Is exercising , is watching her diet  Had some vertigo  - has mostly resolved.   July 09, 2019:  Danielle Fisher is seen today for follow-up visit.   She has a history of coronary artery disease with a non-ST segment elevation and stenting of the right coronary artery in 2016.  She was last seen in May, 2019.  She has a history of diabetes.  Her hemoglobin A1c has been elevated. Wt today is 216 Exercising  Some.   Labs at the [place] recently .   We do not have access to those   July 09, 2020: Danielle Fisher is seen today for a follow-up visit.  She has a history of coronary artery disease.  She has a non-ST segment elevation myocardial infarction and had stenting of the right coronary artery in 2016.  She has a history  of diabetes mellitus.  She has a history of hyperlipidemia.  No CP or dyspnea . Is getting some exercise, walks several times a week  Wt is 199 lbs.   Down 17 lbs from last year  Her lipids were fairly well controlled last year.  [facility] and her rosuvastatin was stopped apparently for elevated liver enzymes.  I do not have any of those labs.  I have asked Lasey to bring her labs to show [place] in the next week or so.  I strongly think that she needs to restart a PCSK9 inhibitor if she is indeed intolerant to rosuvastatin.  Another option would be Inclisiran    January 16, 2022: Danielle Fisher is seen today for follow-up of her coronary artery disease.  She has a history of a non-ST segment  elevation myocardial infarction in 2016.  History of diabetes mellitus and hyperlipidemia. Wt is 189 lbs (  down 10 lbs )  Working out,     Oct. 21, 2024 Danielle Fisher is seen for follow up of her CAD m NSTEMI in 2016.  She is s/p PCi of her RCA 04/21/15       Past Medical History:  Diagnosis Date   Arthritis    "legs, arms" (04/20/2015)   CAD (coronary artery disease)    a. NSTEMI -> LHC 04/21/15: s/p DES to distal RCA; residual moderate ostial rPDA but vessel is overall small; EF 55-65%.   GERD (gastroesophageal reflux disease)    Hyperlipidemia    Hypertension    Kidney stones    Obesity    Thyromegaly 12/21/2012   See CT chest 8/13 /14     Type II diabetes mellitus ([facility])    Vitamin D deficiency 11/06/2016    Past Surgical History:  Procedure Laterality Date   CARDIAC CATHETERIZATION N/A 04/21/2015   Procedure: Left Heart Cath and Coronary Angiography;  Surgeon: Iran Ouch, MD;  Location: [facility];  Service: Cardiovascular;  Laterality: N/A;   CARDIAC CATHETERIZATION N/A 04/21/2015   Procedure: Coronary Stent Intervention;  Surgeon: Iran Ouch, MD;  Location: [facility];  Service: Cardiovascular;  Laterality: N/A;   CESAREAN SECTION  1980   DILATION AND CURETTAGE OF UTERUS  1980's X 1    Current Outpatient Medications  Medication Sig Dispense Refill   amLODipine (NORVASC) 10 MG tablet Take 10 mg by mouth daily.     aspirin EC 81 MG tablet Take 81 mg by mouth daily.     carvedilol (COREG) 25 MG tablet TAKE 1 TABLET TWICE DAILY 180 tablet 1   clopidogrel (PLAVIX) 75 MG tablet Take 1 tablet (75 mg total) by mouth daily. 90 tablet 3   ezetimibe (ZETIA) 10 MG tablet TAKE 1 TABLET EVERY DAY 90 tablet 0   glucose blood (ONETOUCH ULTRA) test strip 1 each by Other route as needed.     insulin glargine (LANTUS) 100 UNIT/ML injection Inject 30 Units into the skin daily.     JARDIANCE 10 MG TABS tablet Take 10 mg by mouth daily.     losartan (COZAAR) 25  MG tablet Take 25 mg by mouth daily.     nitroGLYCERIN (NITROSTAT) 0.4 MG SL tablet Place 1 tablet (0.4 mg total) under the tongue every 5 (five) minutes as needed for chest pain (up to 3 doses). 25 tablet 3   rosuvastatin (CRESTOR) 20 MG tablet TAKE 1 TABLET EVERY DAY 90 tablet 0   SEMAGLUTIDE, 1 MG/DOSE, [place]  Inject 1 mg into the skin every Sunday.     Vitamin D, Ergocalciferol, (DRISDOL) 1.25 MG (50000 UNIT) CAPS capsule Take 50,000 Units by mouth once a week.     No current facility-administered medications for this visit.    Allergies:   Patient has no active allergies.   Social History:  The patient  reports that she has never smoked. She has never been exposed to tobacco smoke. She has never used smokeless tobacco. She reports that she does not drink alcohol and does not use drugs.   Family History:  The patient's family history includes Breast cancer in her sister; Diabetes in her father, sister, and sister; Heart disease in her mother; Stroke in her father and mother.  ROS:  Noted in current hx   Physical Exam: There were no vitals taken for this visit.  No BP recorded.  {Refresh Note OR Click here to enter BP  :1}***    GEN:  Well nourished, well developed in no acute distress HEENT: Normal NECK: No JVD; No carotid bruits LYMPHATICS: No lymphadenopathy CARDIAC: RRR ***, no murmurs, rubs, gallops RESPIRATORY:  Clear to auscultation without rales, wheezing or rhonchi  ABDOMEN: Soft, non-tender, non-distended MUSCULOSKELETAL:  No edema; No deformity  SKIN: Warm and dry NEUROLOGIC:  Alert and oriented x 3   EKG:       Recent Labs: 03/24/2022: ALT 37 06/28/2022: BUN 14; Creatinine, Ser 0.88; Potassium 4.8; Sodium 143  06/28/2022: Chol/HDL Ratio 2.4; Cholesterol, Total 120; HDL 50; LDL Chol Calc (NIH) 57; Triglycerides 58   CrCl cannot be calculated (Patient's most recent lab result is older than the maximum 21 days allowed.).   Wt Readings from Last 3 Encounters:   01/16/22 189 lb 6.4 oz (85.9 kg)  08/31/21 220 lb (99.8 kg)  02/09/21 200 lb (90.7 kg)     Other studies reviewed: Additional studies/records reviewed today include: summarized above  ASSESSMENT AND PLAN:  CAD with recent NSTEMI -        2.  Essential HTN  : Blood pressure is well controlled.   3.   HLD :     4.  History of acute renal insufficiency:       Kristeen Miss, MD  02/22/2023 7:36 AM    [facility] [address] [place],  [address] [place], [place]  [address] Pager [phone] Phone: [phone]; Fax: [phone]

## 2023-02-26 ENCOUNTER — Ambulatory Visit: Payer: Medicare HMO | Admitting: Cardiovascular Disease

## 2023-03-01 ENCOUNTER — Encounter: Payer: Self-pay | Admitting: Cardiovascular Disease

## 2023-03-01 NOTE — Progress Notes (Signed)
Cardiology Office Note Date:  03/02/2023  Patient ID:  Danielle Fisher, Danielle Fisher 23-Feb-1956, MRN [id number] PCP:  [facility]  Cardiologist:  Adrina Armijo  Problem list 1. Coronary artery disease-status post stenting of the distal RCA 2. Hyperlipidemia 3. Essential hypertension 4. Diabetes mellitus  Chief Complaint: f/u NSTEMI    Danielle Fisher is a 67 y.o. female with history of HTN, HLD, DM, recently diagnosed CAD (NSTEMI 04/2015 s/p s/p DES to distal RCA; residual moderate ostial rPDA but vessel overall small; EF 55-65%) who presents for post-hospital follow-up. She had recently been evaluated at the [facility] for chest pain which was deemed possibly related to acid reflux. Outpatient echo had been planned for "enlarged heart" on imaging. In the interim she presented to [facility] for symptoms and ruled in for NSTEMI with subsequent PCI as above on 04/21/15. She was started on ASA/Brilinta. During her hospital stay she was noted to have high blood pressure and ARB was added. Crestor was titrated further.   She comes in for f/u overall doing well. She does notice persistent fatigue that was present even before MI. No CP or SOB. She does wake herself up snoring from time to time. She has never been evaluated for OSA. She is awaiting word back from cardiac rehab in [facility] - interested in participating in their program. BP remains elevated. She did take her medicines already today around 6:30am. Recheck BP in clinic by me was 154/82.  Sep 24, 2015: Primary MD is at the [place]   Danielle Fisher is a 59 yo who I met in Dec, 2016 with UAP. Cath revealed a tight right coronary stenosis. 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  She's done well since that time. She's not had any further episodes of chest pain or shortness of breath.  She has occasional palpitations that last for perhaps a few seconds. She saw her primary medical doctor at the [place] last week. She was noted have some mild locations of  her liver enzymes. She had a GI workup.   Dec. 6, 2017:  Doing well.  No angian  Checks BP at home.   BP fluctuates.   Is generally better than last year.   Is watching her salt .   Sept. 10, 2018"  Montoya is doing well.  No CP or dyspnea.  Getting some exercise  Recent lab work in the [place] Chol = 131 Trigs = 94 HDL = 45 LDL = 67   HbA1C was 9.5   Sep 21, 2017:  Aia is doing well.   No further CP. HbA1C is now 7.5.  Sees Dr. Katrinka Blazing at [facility] center  Is exercising , is watching her diet  Had some vertigo  - has mostly resolved.   July 09, 2019:  Danielle Fisher is seen today for follow-up visit.   She has a history of coronary artery disease with a non-ST segment elevation and stenting of the right coronary artery in 2016.  She was last seen in May, 2019.  She has a history of diabetes.  Her hemoglobin A1c has been elevated. Wt today is 216 Exercising  Some.   Labs at the [place] recently .   We do not have access to those   July 09, 2020: Danielle Fisher is seen today for a follow-up visit.  She has a history of coronary artery disease.  She has a non-ST segment elevation myocardial infarction and had stenting of the right coronary artery in 2016.  She has a history  of diabetes mellitus.  She has a history of hyperlipidemia.  No CP or dyspnea . Is getting some exercise, walks several times a week  Wt is 199 lbs.   Down 17 lbs from last year  Her lipids were fairly well controlled last year.  [facility] and her rosuvastatin was stopped apparently for elevated liver enzymes.  I do not have any of those labs.  I have asked Danielle Fisher to bring her labs to show [place] in the next week or so.  I strongly think that she needs to restart a PCSK9 inhibitor if she is indeed intolerant to rosuvastatin.  Another option would be Inclisiran    January 16, 2022: Danielle Fisher is seen today for follow-up of her coronary artery disease.  She has a history of a non-ST segment  elevation myocardial infarction in 2016.  History of diabetes mellitus and hyperlipidemia. Wt is 189 lbs (  down 10 lbs )  Working out,     Oct. 21, 2024 No show   Oct. 25, 2024  Danielle Fisher is seen for follow up of her CAD m NSTEMI in 2016.  She is s/p PCI of her RCA 04/21/15 Wt is 205 lbs  ( up 16 lbs )  Having issues with neuropathy - was started on Gabapentin   We discussed watching foods that are white, wheat, sweet     Past Medical History:  Diagnosis Date   Arthritis    "legs, arms" (04/20/2015)   CAD (coronary artery disease)    a. NSTEMI -> LHC 04/21/15: s/p DES to distal RCA; residual moderate ostial rPDA but vessel is overall small; EF 55-65%.   GERD (gastroesophageal reflux disease)    Hyperlipidemia    Hypertension    Kidney stones    Obesity    Thyromegaly 12/21/2012   See CT chest 8/13 /14     Type II diabetes mellitus (HCC)    Vitamin D deficiency 11/06/2016    Past Surgical History:  Procedure Laterality Date   CARDIAC CATHETERIZATION N/A 04/21/2015   Procedure: Left Heart Cath and Coronary Angiography;  Surgeon: Iran Ouch, MD;  Location: [facility] INVASIVE CV LAB;  Service: Cardiovascular;  Laterality: N/A;   CARDIAC CATHETERIZATION N/A 04/21/2015   Procedure: Coronary Stent Intervention;  Surgeon: Iran Ouch, MD;  Location: [facility] INVASIVE CV LAB;  Service: Cardiovascular;  Laterality: N/A;   CESAREAN SECTION  1980   DILATION AND CURETTAGE OF UTERUS  1980's X 1    Current Outpatient Medications  Medication Sig Dispense Refill   amLODipine (NORVASC) 10 MG tablet Take 10 mg by mouth daily.     aspirin EC 81 MG tablet Take 81 mg by mouth daily.     carvedilol (COREG) 25 MG tablet Take 25 mg by mouth daily.     clopidogrel (PLAVIX) 75 MG tablet Take 1 tablet (75 mg total) by mouth daily. 90 tablet 3   ezetimibe (ZETIA) 10 MG tablet TAKE 1 TABLET EVERY DAY 90 tablet 0   gabapentin (NEURONTIN) 300 MG capsule Take 300 mg by mouth at bedtime.     glucose  blood (ONETOUCH ULTRA) test strip 1 each by Other route as needed.     insulin glargine (LANTUS) 100 UNIT/ML injection Inject 30 Units into the skin daily.     JARDIANCE 10 MG TABS tablet Take 10 mg by mouth daily.     losartan (COZAAR) 25 MG tablet Take 25 mg by mouth daily.  nitroGLYCERIN (NITROSTAT) 0.4 MG SL tablet Place 1 tablet (0.4 mg total) under the tongue every 5 (five) minutes as needed for chest pain (up to 3 doses). 25 tablet 3   rosuvastatin (CRESTOR) 20 MG tablet TAKE 1 TABLET EVERY DAY 90 tablet 0   SEMAGLUTIDE, 1 MG/DOSE, [place] Inject 1 mg into the skin every Sunday.     Vitamin D, Ergocalciferol, (DRISDOL) 1.25 MG (50000 UNIT) CAPS capsule Take 50,000 Units by mouth once a week.     No current facility-administered medications for this visit.    Allergies:   Patient has no active allergies.   Social History:  The patient  reports that she has never smoked. She has never been exposed to tobacco smoke. She has never used smokeless tobacco. She reports that she does not drink alcohol and does not use drugs.   Family History:  The patient's family history includes Breast cancer in her sister; Diabetes in her father, sister, and sister; Heart disease in her mother; Stroke in her father and mother.  ROS:  Noted in current hx   Physical Exam: Blood pressure 110/60, pulse 78, height 5\' 7"  (1.702 m), weight 205 lb 12.8 oz (93.4 kg), SpO2 98%.       GEN:  Well nourished, well developed in no acute distress HEENT: Normal NECK: No JVD; No carotid bruits LYMPHATICS: No lymphadenopathy CARDIAC: RRR , no murmurs, rubs, gallops RESPIRATORY:  Clear to auscultation without rales, wheezing or rhonchi  ABDOMEN: Soft, non-tender, non-distended MUSCULOSKELETAL:  No edema; No deformity  SKIN: Warm and dry NEUROLOGIC:  Alert and oriented x 3   EKG:   EKG Interpretation Date/Time:  Friday March 02 2023 14:24:57 EDT Ventricular Rate:  78 PR Interval:  250 QRS Duration:  96 QT  Interval:  386 QTC Calculation: 440 R Axis:   -2  Text Interpretation: Sinus rhythm with 1st degree A-V block Minimal voltage criteria for LVH, may be normal variant ( R in aVL ) When compared with ECG of 09-Feb-2021 21:03, No significant change since last tracing Confirmed by Kristeen Miss [phone]) on 03/02/2023 5:37:51 PM       Recent Labs: 03/24/2022: ALT 37 06/28/2022: BUN 14; Creatinine, Ser 0.88; Potassium 4.8; Sodium 143  06/28/2022: Chol/HDL Ratio 2.4; Cholesterol, Total 120; HDL 50; LDL Chol Calc (NIH) 57; Triglycerides 58   CrCl cannot be calculated (Patient's most recent lab result is older than the maximum 21 days allowed.).   Wt Readings from Last 3 Encounters:  03/02/23 205 lb 12.8 oz (93.4 kg)  01/16/22 189 lb 6.4 oz (85.9 kg)  08/31/21 220 lb (99.8 kg)     Other studies reviewed: Additional studies/records reviewed today include: summarized above  ASSESSMENT AND PLAN:  CAD with recent NSTEMI -   she is very stable.  No angina       2.  Essential HTN  :   BP is very well controlled.    3.   HLD :  her last LDL is 57 .  Continue current meds.    4.  History of acute renal insufficiency:   Follow up with nephrology / primary care      Kristeen Miss, MD  03/02/2023 5:37 PM    [facility] [address] [place],  [address] [place], [place]  [address] Pager [phone] Phone: [phone]; Fax: [phone]

## 2023-03-02 ENCOUNTER — Encounter: Payer: Self-pay | Admitting: Cardiovascular Disease

## 2023-03-02 ENCOUNTER — Ambulatory Visit: Payer: Medicare HMO | Attending: Cardiovascular Disease | Admitting: Cardiovascular Disease

## 2023-03-02 VITALS — BP 110/60 | HR 78 | Ht 67.0 in | Wt 205.8 lb

## 2023-03-02 DIAGNOSIS — I10 Essential (primary) hypertension: Secondary | ICD-10-CM | POA: Diagnosis not present

## 2023-03-02 DIAGNOSIS — I2510 Atherosclerotic heart disease of native coronary artery without angina pectoris: Secondary | ICD-10-CM | POA: Diagnosis not present

## 2023-03-02 NOTE — Patient Instructions (Signed)
Follow-Up: At [facility], you and your health needs are our priority.  As part of our continuing mission to provide you with exceptional heart care, we have created designated Provider Care Teams.  These Care Teams include your primary Cardiologist (physician) and Advanced Practice Providers (APPs -  Physician Assistants and Nurse Practitioners) who all work together to provide you with the care you need, when you need it.  Your next appointment:   1 year(s)  Provider:   Kristeen Miss, MD

## 2023-04-08 ENCOUNTER — Other Ambulatory Visit: Payer: Self-pay | Admitting: Cardiovascular Disease

## 2023-04-08 DIAGNOSIS — E782 Mixed hyperlipidemia: Secondary | ICD-10-CM

## 2023-04-08 DIAGNOSIS — R7989 Other specified abnormal findings of blood chemistry: Secondary | ICD-10-CM

## 2023-04-25 ENCOUNTER — Other Ambulatory Visit: Payer: Self-pay | Admitting: Cardiovascular Disease

## 2023-04-25 DIAGNOSIS — Z79899 Other long term (current) drug therapy: Secondary | ICD-10-CM

## 2023-05-07 ENCOUNTER — Other Ambulatory Visit: Payer: Self-pay

## 2023-05-07 MED ORDER — CLOPIDOGREL BISULFATE 75 MG PO TABS: 75.0000 mg | ORAL_TABLET | Freq: Every day | ORAL | 2 refills | Status: DC

## 2023-06-12 ENCOUNTER — Encounter (HOSPITAL_BASED_OUTPATIENT_CLINIC_OR_DEPARTMENT_OTHER): Payer: Self-pay

## 2023-06-12 ENCOUNTER — Emergency Department (HOSPITAL_BASED_OUTPATIENT_CLINIC_OR_DEPARTMENT_OTHER)
Admission: EM | Admit: 2023-06-12 | Discharge: 2023-06-12 | Disposition: A | Discharge status: Home / Self Care | Payer: Medicare HMO | Attending: Emergency Medicine | Admitting: Emergency Medicine

## 2023-06-12 ENCOUNTER — Other Ambulatory Visit (HOSPITAL_BASED_OUTPATIENT_CLINIC_OR_DEPARTMENT_OTHER): Payer: Self-pay

## 2023-06-12 DIAGNOSIS — M545 Low back pain, unspecified: Secondary | ICD-10-CM | POA: Diagnosis not present

## 2023-06-12 DIAGNOSIS — Z6831 Body mass index (BMI) 31.0-31.9, adult: Secondary | ICD-10-CM | POA: Insufficient documentation

## 2023-06-12 DIAGNOSIS — R81 Glycosuria: Secondary | ICD-10-CM | POA: Insufficient documentation

## 2023-06-12 DIAGNOSIS — I2510 Atherosclerotic heart disease of native coronary artery without angina pectoris: Secondary | ICD-10-CM | POA: Diagnosis not present

## 2023-06-12 DIAGNOSIS — E669 Obesity, unspecified: Secondary | ICD-10-CM | POA: Diagnosis not present

## 2023-06-12 DIAGNOSIS — Z7982 Long term (current) use of aspirin: Secondary | ICD-10-CM | POA: Insufficient documentation

## 2023-06-12 DIAGNOSIS — E119 Type 2 diabetes mellitus without complications: Secondary | ICD-10-CM | POA: Diagnosis not present

## 2023-06-12 DIAGNOSIS — I10 Essential (primary) hypertension: Secondary | ICD-10-CM | POA: Insufficient documentation

## 2023-06-12 DIAGNOSIS — Z794 Long term (current) use of insulin: Secondary | ICD-10-CM | POA: Diagnosis not present

## 2023-06-12 DIAGNOSIS — Z79899 Other long term (current) drug therapy: Secondary | ICD-10-CM | POA: Insufficient documentation

## 2023-06-12 DIAGNOSIS — M549 Dorsalgia, unspecified: Secondary | ICD-10-CM | POA: Diagnosis present

## 2023-06-12 LAB — URINALYSIS, MICROSCOPIC (REFLEX)

## 2023-06-12 LAB — URINALYSIS, ROUTINE W REFLEX MICROSCOPIC
Bilirubin Urine: NEGATIVE
Glucose, UA: >=500 mg/dL — AB
Hgb urine dipstick: NEGATIVE
Ketones, ur: NEGATIVE mg/dL
Leukocytes,Ua: NEGATIVE
Nitrite: NEGATIVE
Protein, ur: 30 mg/dL — AB
Specific Gravity, Urine: 1.02 (ref 1.005–1.030)
pH: 5.5 (ref 5.0–8.0)

## 2023-06-12 LAB — CBG MONITORING, ED: Glucose-Capillary: 88 mg/dL (ref 70–99)

## 2023-06-12 MED ORDER — ACETAMINOPHEN 500 MG PO TABS
1000.0000 mg | ORAL_TABLET | Freq: Once | ORAL | Status: AC
  Administered 2023-06-12: 1000 mg via ORAL
  Filled 2023-06-12: qty 2

## 2023-06-12 MED ORDER — LIDOCAINE 5 % EX PTCH
1.0000 | MEDICATED_PATCH | Freq: Once | CUTANEOUS | Status: DC
  Administered 2023-06-12: 1 via TRANSDERMAL
  Filled 2023-06-12: qty 1

## 2023-06-12 MED ORDER — OXYCODONE HCL 5 MG PO TABS: 5.0000 mg | ORAL_TABLET | Freq: Four times a day (QID) | ORAL | 0 refills | Status: DC | PRN

## 2023-06-12 MED ORDER — CYCLOBENZAPRINE HCL 10 MG PO TABS
10.0000 mg | ORAL_TABLET | Freq: Once | ORAL | Status: AC
  Administered 2023-06-12: 10 mg via ORAL
  Filled 2023-06-12: qty 1

## 2023-06-12 MED ORDER — OXYCODONE HCL 5 MG PO TABS
5.0000 mg | ORAL_TABLET | Freq: Four times a day (QID) | ORAL | 0 refills | Status: AC | PRN
  Filled 2023-06-12: qty 9, 3d supply, fill #0

## 2023-06-12 MED ORDER — OXYCODONE HCL 5 MG PO TABS
5.0000 mg | ORAL_TABLET | Freq: Once | ORAL | Status: DC
  Filled 2023-06-12: qty 1

## 2023-06-12 NOTE — ED Triage Notes (Signed)
C/o lower back pain starting Friday was lifting things before it started. Also C/o urinary frequency. Denies abdominal pain, N/V/D or fever.

## 2023-06-12 NOTE — ED Provider Notes (Signed)
 [place] [facility] Provider Note   CSN: [id number] Arrival date & time: 06/12/23  9156     History  Chief Complaint  Patient presents with   Back Pain    Danielle Fisher is a 68 y.o. female.  Patient is a 68 year old female with a past medical history of CAD, hypertension, diabetes presenting to the emergency department with back pain.  Patient states that she has had pain across her lower back since Friday.  She denied any heavy lifting or trauma to me but did report to triage that she was lifting things prior to the pain starting.  She states that the pain is worse with any movement.  She states that it initially felt like a sore muscle however she tried heating packs at home without relief and the pain worsened so she was concerned she might have a UTI.  She states that she has had urinary frequency but drinks a lot of water and otherwise denies any dysuria hematuria, abdominal pain, fevers or flank pain.  She denies any associated numbness or weakness, saddle anesthesia, loss of bowel or bladder function.  The history is provided by the patient.  Back Pain      Home Medications Prior to Admission medications   Medication Sig Start Date End Date Taking? Authorizing Provider  oxyCODONE  (ROXICODONE ) 5 MG immediate release tablet Take 1 tablet (5 mg total) by mouth every 6 (six) hours as needed. 06/12/23  Yes Ellouise, Thedford Bunton K, DO  amLODipine  (NORVASC ) 10 MG tablet Take 10 mg by mouth daily.    [provider]  aspirin  EC 81 MG tablet Take 81 mg by mouth daily.    [provider]  carvedilol  (COREG ) 25 MG tablet Take 25 mg by mouth daily.    [provider]  clopidogrel  (PLAVIX ) 75 MG tablet Take 1 tablet (75 mg total) by mouth daily. 05/07/23   Nahser, Aleene PARAS, MD  ezetimibe  (ZETIA ) 10 MG tablet TAKE 1 TABLET EVERY DAY 04/25/23   Nahser, Aleene PARAS, MD  gabapentin (NEURONTIN) 300 MG capsule Take 300 mg by mouth at  bedtime.    [provider]  glucose blood (ONETOUCH ULTRA) test strip 1 each by Other route as needed. 11/01/17   [provider]  insulin  glargine (LANTUS ) 100 UNIT/ML injection Inject 30 Units into the skin daily.    [provider]  JARDIANCE 10 MG TABS tablet Take 10 mg by mouth daily. 10/18/21   [provider]  losartan  (COZAAR ) 25 MG tablet Take 25 mg by mouth daily. 11/25/21   [provider]  nitroGLYCERIN  (NITROSTAT ) 0.4 MG SL tablet Place 1 tablet (0.4 mg total) under the tongue every 5 (five) minutes as needed for chest pain (up to 3 doses). 04/22/15   Dunn, Dayna N, PA-C  rosuvastatin  (CRESTOR ) 20 MG tablet TAKE 1 TABLET EVERY DAY 04/09/23   Nahser, Aleene PARAS, MD  [facility], 1 MG/DOSE, [place] Inject 1 mg into the skin every Sunday.    [provider]  Vitamin D, Ergocalciferol, (DRISDOL) 1.25 MG (50000 UNIT) CAPS capsule Take 50,000 Units by mouth once a week. 10/11/21   [provider]      Allergies    Patient has no known allergies.    Review of Systems   Review of Systems  Musculoskeletal:  Positive for back pain.    Physical Exam Updated Vital Signs BP (!) 132/90 (BP Location: Left Arm)   Pulse (!) 101  Temp 98.3 F (36.8 C) (Oral)   Resp 18   Ht 5' 7 (1.702 m)   Wt 91.6 kg   SpO2 99%   BMI 31.64 kg/m  Physical Exam Vitals and nursing note reviewed.  Constitutional:      General: She is not in acute distress.    Appearance: Normal appearance. She is obese.  HENT:     Head: Normocephalic and atraumatic.     Nose: Nose normal.     Mouth/Throat:     Mouth: Mucous membranes are moist.  Eyes:     Extraocular Movements: Extraocular movements intact.  Neck:     Comments: no midline neck tenderness Cardiovascular:     Rate and Rhythm: Normal rate and regular rhythm.     Heart sounds: Normal heart sounds.  Pulmonary:     Effort: Pulmonary effort is normal.     Breath sounds: Normal breath sounds.   Abdominal:     General: Abdomen is flat.     Palpations: Abdomen is soft.     Tenderness: There is no abdominal tenderness. There is no right CVA tenderness or left CVA tenderness.  Musculoskeletal:        General: Normal range of motion.     Cervical back: Normal range of motion and neck supple.     Comments: No midline back tenderness, bilateral lumbar paraspinal muscle tenderness to palpation without overlying skin changes  Skin:    General: Skin is warm and dry.  Neurological:     General: No focal deficit present.     Mental Status: She is alert and oriented to person, place, and time.     Sensory: No sensory deficit.     Motor: No weakness (5 out of 5 strength in bilateral hip flexion, knee extension, plantar/dorsi flexion).  Psychiatric:        Mood and Affect: Mood normal.        Behavior: Behavior normal.     ED Results / Procedures / Treatments   Labs (all labs ordered are listed, but only abnormal results are displayed) Labs Reviewed  URINALYSIS, ROUTINE W REFLEX MICROSCOPIC - Abnormal; Notable for the following components:      Result Value   APPearance HAZY (*)    Glucose, UA >=500 (*)    Protein, ur 30 (*)    All other components within normal limits  URINALYSIS, MICROSCOPIC (REFLEX) - Abnormal; Notable for the following components:   Bacteria, UA FEW (*)    All other components within normal limits  CBG MONITORING, ED    EKG None  Radiology No results found.  Procedures Procedures    Medications Ordered in ED Medications  lidocaine  (LIDODERM ) 5 % 1-3 patch (1 patch Transdermal Patch Applied 06/12/23 1056)  oxyCODONE  (Oxy IR/ROXICODONE ) immediate release tablet 5 mg (has no administration in time range)  acetaminophen  (TYLENOL ) tablet 1,000 mg (1,000 mg Oral Given 06/12/23 1051)  cyclobenzaprine  (FLEXERIL ) tablet 10 mg (10 mg Oral Given 06/12/23 1051)    ED Course/ Medical Decision Making/ A&P Clinical Course as of 06/12/23 1225  Tue Jun 12, 2023   1222 Patient reports no significant improvement of pain. Will be given short course of narcotics. Recommended close PCP follow up. [VK]    Clinical Course User Index [VK] Kingsley, Alen Matheson K, DO                                 Medical Decision  Making This patient presents to the ED with chief complaint(s) of back pain with pertinent past medical history of CAD, hypertension, diabetes which further complicates the presenting complaint. The complaint involves an extensive differential diagnosis and also carries with it a high risk of complications and morbidity.    The differential diagnosis includes muscle strain or spasm, no significant trauma or midline back tenderness making a fracture unlikely, no neurologic deficits, loss of bowel or bladder function making cauda equina unlikely, no fever or recent spinal manipulation making epidural abscess unlikely, considering UTI with urinary frequency but no signs of pyelonephritis on exam  Additional history obtained: Additional history obtained from N/A Records reviewed Care Everywhere/External Records  ED Course and Reassessment: On patient's arrival she is hemodynamically stable in no acute distress.  Was initially evaluated in triage and had urine performed.  Urine showed glucosuria but no signs of infection.  Exam appears consistent with likely muscle strain or spasm.  Will be treated with Tylenol , Toradol and lidocaine  patch.  Will have Accu-Chek to evaluate for hyperglycemia in the setting of her glucosuria causing her urinary frequency and will be closely reassessed.  Independent labs interpretation:  The following labs were independently interpreted: glucosuria, otherwise UA normal  Independent visualization of imaging: - N/A  Consultation: - Consulted or discussed management/test interpretation w/ external professional: N/A  Consideration for admission or further workup: Patient has no emergent conditions requiring admission or  further work-up at this time and is stable for discharge home with primary care follow-up  Social Determinants of health: N/A    Amount and/or Complexity of Data Reviewed Labs: ordered.  Risk OTC drugs. Prescription drug management.          Final Clinical Impression(s) / ED Diagnoses Final diagnoses:  Acute bilateral low back pain without sciatica    Rx / DC Orders ED Discharge Orders          Ordered    oxyCODONE  (ROXICODONE ) 5 MG immediate release tablet  Every 6 hours PRN        06/12/23 1224              Kingsley, Mykai Wendorf K, DO 06/12/23 1225

## 2023-06-12 NOTE — Discharge Instructions (Signed)
 Seen in the emergency department for your back pain.  You have no signs of urinary tract infection and likely pulled a muscle in your back.  You can take Tylenol  every 6 hours as needed for pain and I would do heat packs and try to stretch her back.  I have given you a few oxycodone  for breakthrough pain.  This can make you drowsy so do not take it while driving, working or operating heavy machinery.  It can also make you constipated so I do recommend taking a stool softener while you are on this.  You should follow-up with your primary doctor in the next few days to have your symptoms rechecked.  You should return to the emergency department if you are having significantly worsening pain, develop numbness or weakness in your legs and you are unable to walk, you are unable to urinate or if you have any other new or concerning symptoms.

## 2023-06-12 NOTE — ED Notes (Signed)
Given gingerale and pack of peanut butter crackers

## 2023-08-09 ENCOUNTER — Telehealth: Payer: Self-pay | Admitting: Cardiovascular Disease

## 2023-08-09 NOTE — Telephone Encounter (Signed)
 Pt c/o medication issue:  1. Name of Medication:   carvedilol (COREG) 25 MG tablet   2. How are you currently taking this medication (dosage and times per day)?   Not taken for 2 months  3. Are you having a reaction (difficulty breathing--STAT)?   4. What is your medication issue?   Caller Bonita Quin) stated patient has been without this medication for 2 months due to issues filling the medication at the pharmacy.  Caller stated Dr. Raquel James wants to know if patient should re-start this medication as patient had a NSTEMI in 2016.  Caller noted patient's BP today was  118/81.

## 2023-08-10 NOTE — Telephone Encounter (Signed)
 Reached back out to patient who did not answer. Left detailed voicemail per DPR informing her that Dr Elease Hashimoto does still want her to be taking the Carvedilol 25mg  and to call back if she has any issues.  Nahser, [name], MD  You; [place], Corrie Dandy P, [place] hours ago (7:00 PM)    I would recommend that she continue it  PN

## 2023-08-13 MED ORDER — CARVEDILOL 25 MG PO TABS: 25.0000 mg | ORAL_TABLET | Freq: Every day | ORAL | 3 refills | Status: DC

## 2023-08-13 NOTE — Addendum Note (Signed)
 Addended by: Lars Mage on: 08/13/2023 11:42 AM   Modules accepted: Orders

## 2023-08-13 NOTE — Telephone Encounter (Signed)
 Patient returned call to office and confirmed that she is still on Coreg 25mg  daily, but needs a new prescription send to [facility] mail order. Completed at this time.

## 2023-09-18 IMAGING — CT CT RENAL STONE PROTOCOL
2 of 4 series · 16 of 46 positions shown, 18 images · non-contrast
Comparison: CT 04/11/2021, 11/15/2020

CLINICAL DATA: Flank pain, kidney stone suspected

Patient reports right-sided back pain frequent urination.
EXAM:
CT ABDOMEN AND PELVIS WITHOUT CONTRAST
TECHNIQUE: Multidetector CT imaging of the abdomen and pelvis was performed
following the standard protocol without IV contrast.
RADIATION DOSE REDUCTION: This exam was performed according to the
departmental dose-optimization program which includes automated
exposure control, adjustment of the mA and/or kV according to
patient size and/or use of iterative reconstruction technique.

[Series 3: axial st · axial · 0.95mm/px · z∈[+664,+1124]mm · 13 of 100 slices shown, 15 images]
[im 4/100  soft-tissue]
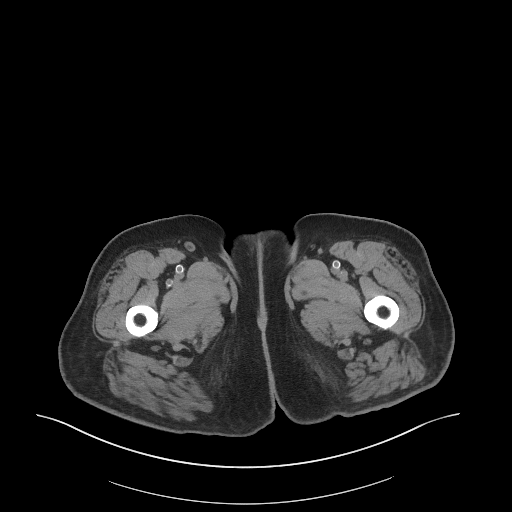
[im 4/100  bone]
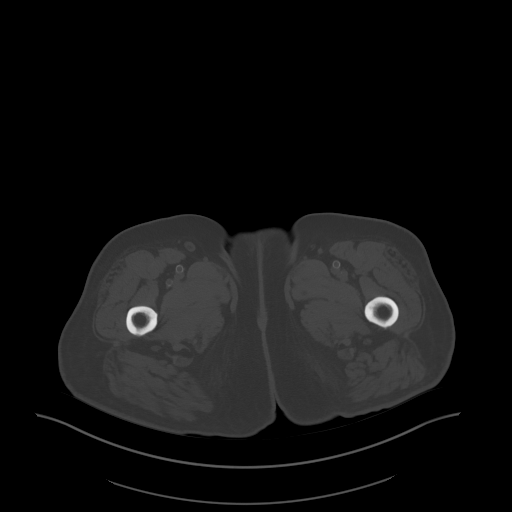
[im 12/100  soft-tissue]
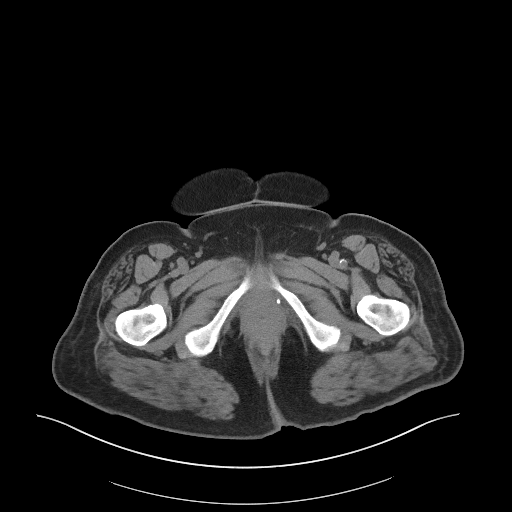
[im 20/100  soft-tissue]
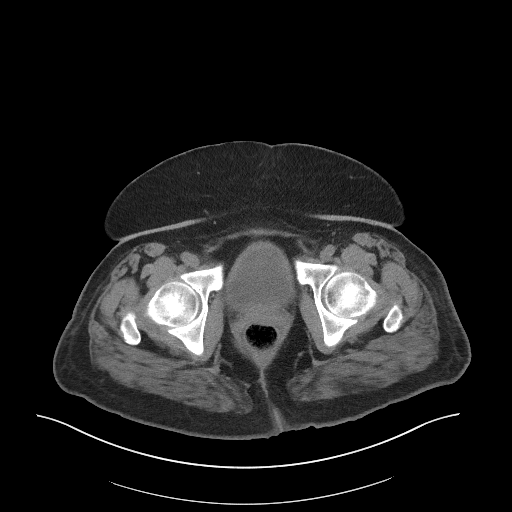
[im 28/100  soft-tissue]
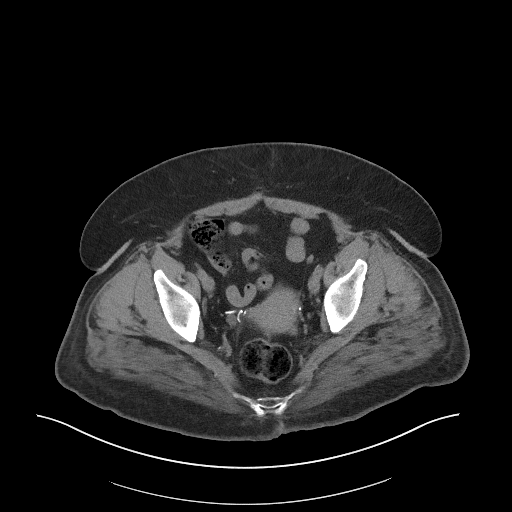
[im 36/100  soft-tissue]
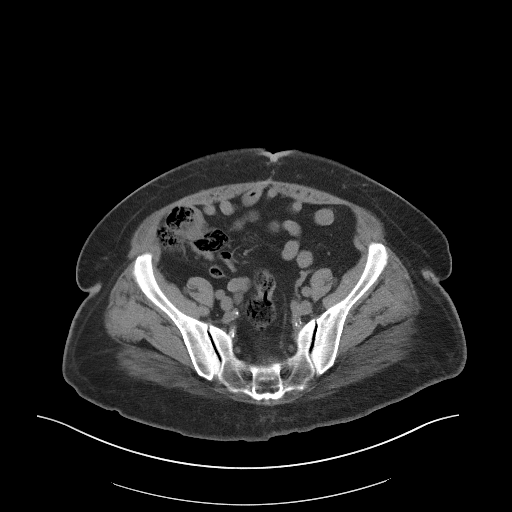
[im 44/100  soft-tissue]
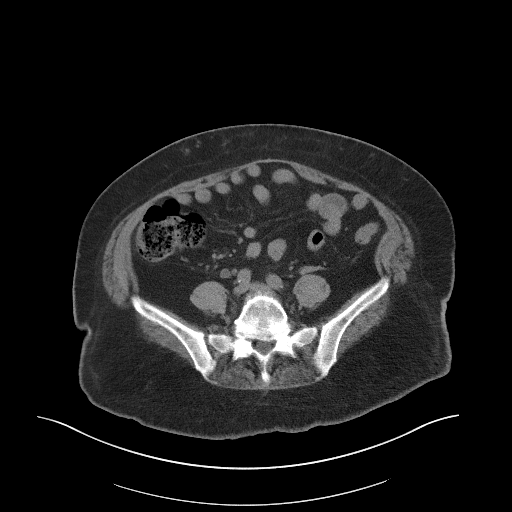
[im 52/100  soft-tissue]
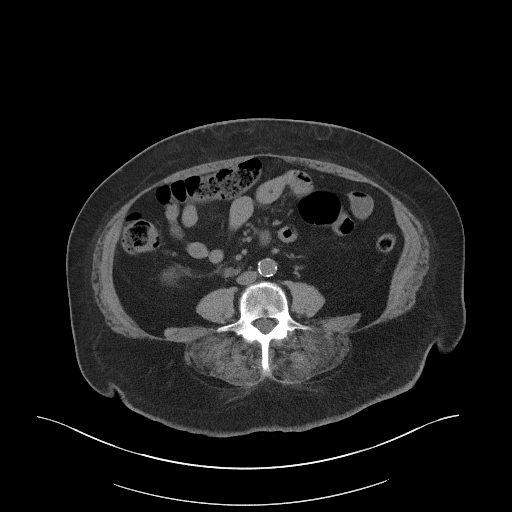
[im 56/100  soft-tissue]
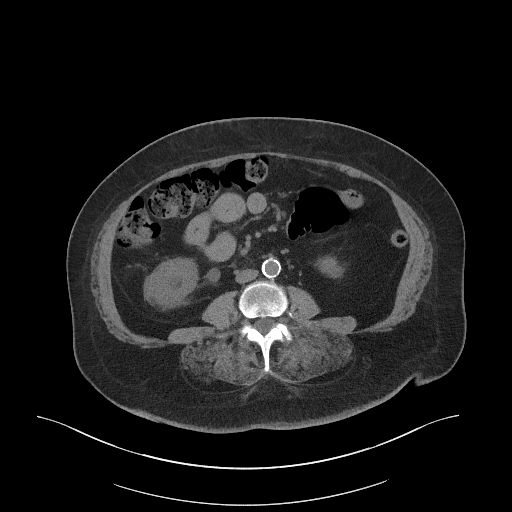
[im 64/100  soft-tissue]
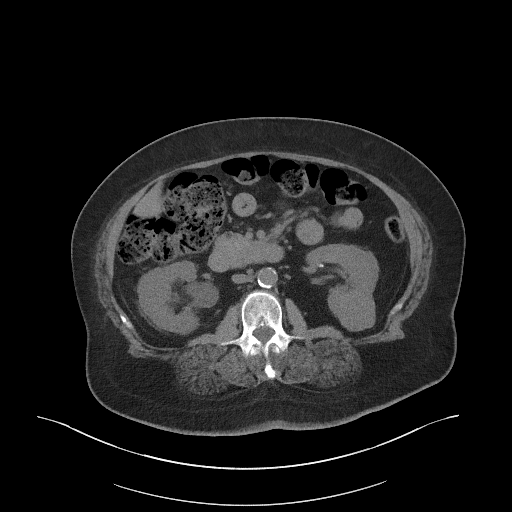
[im 64/100  bone]
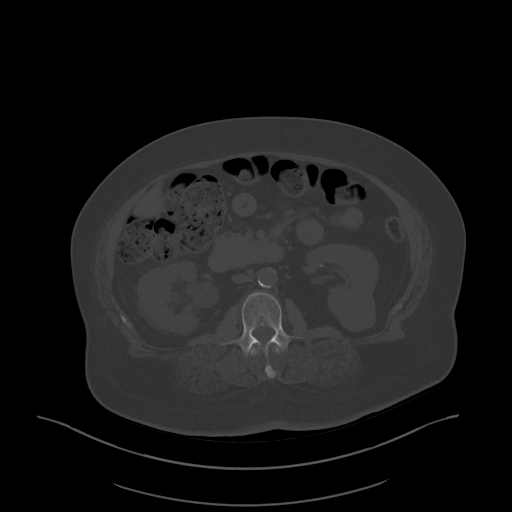
[im 72/100  soft-tissue]
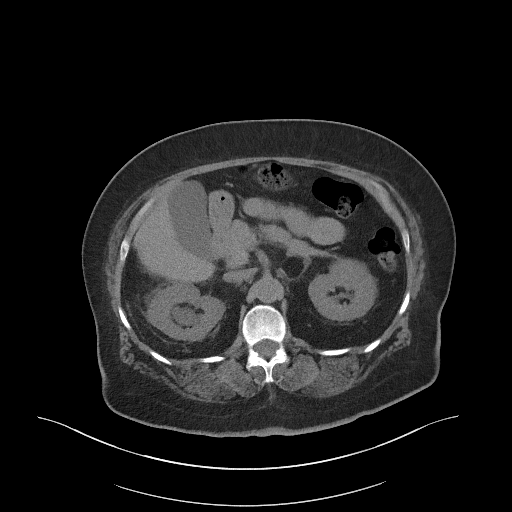
[im 80/100  soft-tissue]
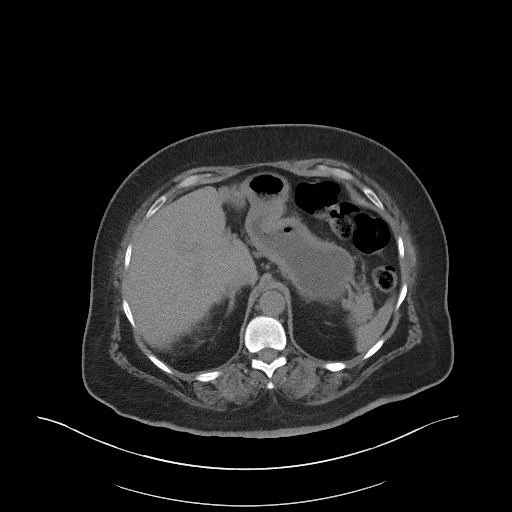
[im 88/100  soft-tissue]
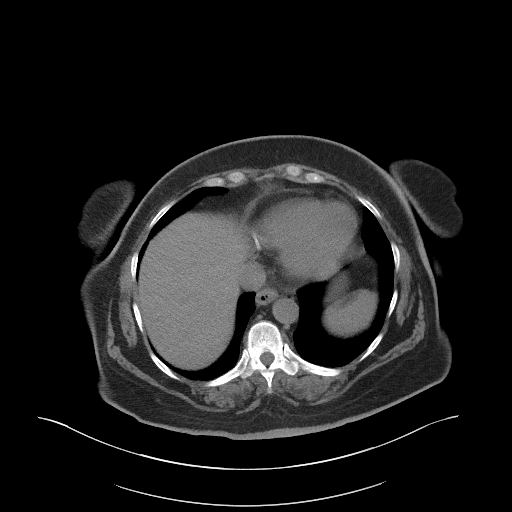
[im 96/100  soft-tissue]
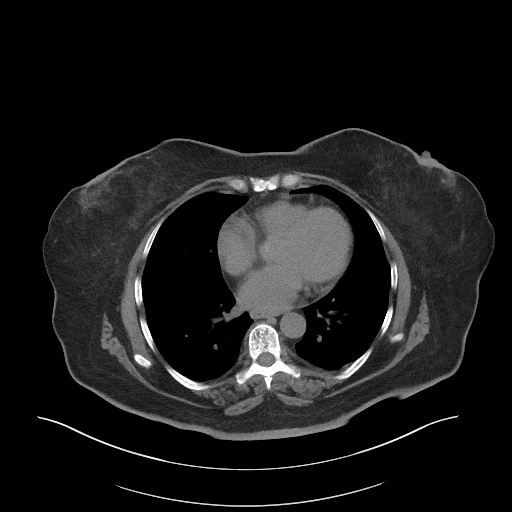

[Series 6: coronal st · coronal · 0.93mm/px · 3 of 100 slices shown]
[im 34/100  soft-tissue]
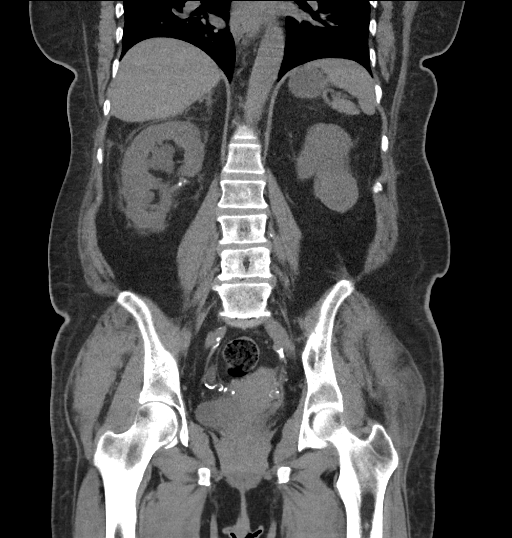
[im 45/100  soft-tissue]
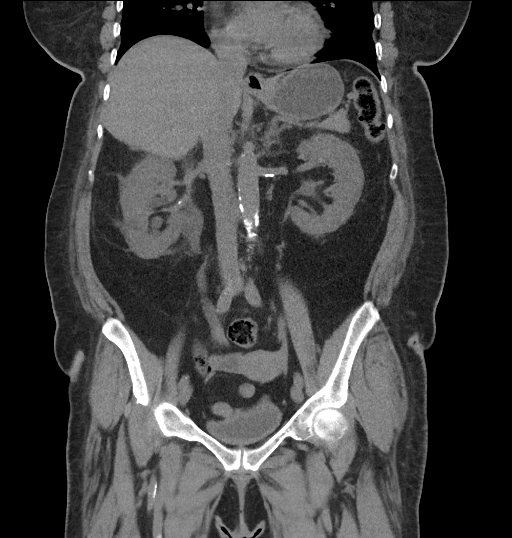
[im 56/100  soft-tissue]
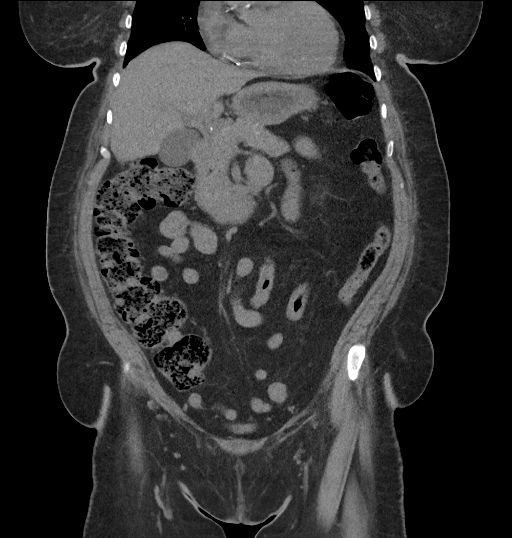

[16 of 46 positions shown; findings below may reference images not displayed]

FINDINGS: Lower chest: No confluent consolidation or pleural effusion. There
are coronary artery calcifications.

Hepatobiliary: No focal liver abnormality is seen. No gallstones,
gallbladder wall thickening, or biliary dilatation.

Pancreas: No ductal dilatation or inflammation.

Spleen: Normal in size without focal abnormality.

Adrenals/Urinary Tract: Normal adrenal glands moderate right
hydroureteronephrosis. There is a 9 mm stone in the midline urinary
bladder that likely represents a recently passed stone. No
additional right ureteral calculi. Mild right perinephric edema. No
intrarenal calculi. There is no left hydronephrosis. Calcifications
at the left renal hilum may represent combination of nonobstructing
stones and vascular calcifications. There is a 3.6 cm lesion arising
from the posterior left kidney with Hounsfield units 21. This is
stable from prior CT, and represented cyst on 11/17/2020 ultrasound.
9 mm stone in the urinary bladder. No bladder wall thickening.

Stomach/Bowel: Small hiatal hernia. Ingested material and or fluid
in the stomach. There is no small bowel obstruction or inflammation.
Normal appendix. Moderate colonic stool burden. No colonic
inflammation.

Vascular/Lymphatic: Moderate aortic atherosclerosis. No aortic
aneurysm. No portal venous or mesenteric gas. No bulky
abdominopelvic adenopathy.

Reproductive: Quiescent uterus.  No adnexal mass.

Other: No free air or ascites.  No abdominal wall hernia.

Musculoskeletal: L5-S1 and L4-L5 degenerative disc disease. There
are no acute or suspicious osseous abnormalities.
IMPRESSION: 1. Moderate right hydroureteronephrosis with a 9 mm stone in the
midline urinary bladder that likely represents a recently passed
stone.
2. Calcifications at the left renal hilum may represent combination
of nonobstructing stones and vascular calcifications.
3. Small hiatal hernia.
4. There is a 3.6 cm lesion arising from the posterior left kidney
that is most consistent with complex cyst, cyst was seen in this
region on prior renal ultrasound.

Aortic Atherosclerosis ([id number]).

## 2023-10-31 ENCOUNTER — Telehealth: Payer: Self-pay | Admitting: Cardiovascular Disease

## 2023-10-31 DIAGNOSIS — E782 Mixed hyperlipidemia: Secondary | ICD-10-CM

## 2023-10-31 DIAGNOSIS — R7989 Other specified abnormal findings of blood chemistry: Secondary | ICD-10-CM

## 2023-10-31 DIAGNOSIS — Z79899 Other long term (current) drug therapy: Secondary | ICD-10-CM

## 2023-10-31 MED ORDER — ROSUVASTATIN CALCIUM 20 MG PO TABS: 20.0000 mg | ORAL_TABLET | Freq: Every day | ORAL | 0 refills | Status: DC

## 2023-10-31 MED ORDER — EZETIMIBE 10 MG PO TABS: 10.0000 mg | ORAL_TABLET | Freq: Every day | ORAL | 0 refills | Status: DC

## 2023-10-31 MED ORDER — CLOPIDOGREL BISULFATE 75 MG PO TABS: 75.0000 mg | ORAL_TABLET | Freq: Every day | ORAL | 0 refills | Status: DC

## 2023-10-31 NOTE — Telephone Encounter (Signed)
 RX sent to requested Pharmacy

## 2023-10-31 NOTE — Telephone Encounter (Signed)
*  STAT* If patient is at the pharmacy, call can be transferred to refill team.   1. Which medications need to be refilled? (please list name of each medication and dose if known) clopidogrel  (PLAVIX ) 75 MG tablet ,  ezetimibe  (ZETIA ) 10 MG tablet  , rosuvastatin  (CRESTOR ) 20 MG tablet   2. Would you like to learn more about the convenience, safety, & potential cost savings by using the [facility]? NO   3. Are you open to using the [facility] NO   4. Which pharmacy/location (including street and city if local pharmacy) is medication to be sent to? [facility] - [place], [place] - [address]    5. Do they need a 30 day or 90 day supply? 90

## 2023-10-31 NOTE — Telephone Encounter (Signed)
 Patient really wants Dr. Allena recommendation on who she should see going forward. Any recommendations?

## 2023-11-01 NOTE — Telephone Encounter (Signed)
 Spoke with pt, aware of dr nahser's recommendations.

## 2024-01-01 ENCOUNTER — Other Ambulatory Visit: Payer: Self-pay | Admitting: Physician Assistant

## 2024-01-01 MED ORDER — CLOPIDOGREL BISULFATE 75 MG PO TABS: 75.0000 mg | ORAL_TABLET | Freq: Every day | ORAL | 0 refills | Status: DC

## 2024-01-03 ENCOUNTER — Other Ambulatory Visit: Payer: Self-pay | Admitting: Physician Assistant

## 2024-01-03 DIAGNOSIS — Z79899 Other long term (current) drug therapy: Secondary | ICD-10-CM

## 2024-01-03 MED ORDER — EZETIMIBE 10 MG PO TABS: 10.0000 mg | ORAL_TABLET | Freq: Every day | ORAL | 0 refills | Status: DC

## 2024-01-11 ENCOUNTER — Other Ambulatory Visit: Payer: Self-pay | Admitting: Physician Assistant

## 2024-01-11 DIAGNOSIS — R7989 Other specified abnormal findings of blood chemistry: Secondary | ICD-10-CM

## 2024-01-11 DIAGNOSIS — E782 Mixed hyperlipidemia: Secondary | ICD-10-CM

## 2024-01-15 ENCOUNTER — Other Ambulatory Visit: Payer: Self-pay

## 2024-01-15 DIAGNOSIS — R7989 Other specified abnormal findings of blood chemistry: Secondary | ICD-10-CM

## 2024-01-15 DIAGNOSIS — E782 Mixed hyperlipidemia: Secondary | ICD-10-CM

## 2024-01-16 MED ORDER — ROSUVASTATIN CALCIUM 20 MG PO TABS: 20.0000 mg | ORAL_TABLET | Freq: Every day | ORAL | 0 refills | Status: DC

## 2024-03-13 ENCOUNTER — Other Ambulatory Visit: Payer: Self-pay | Admitting: Physician Assistant

## 2024-03-13 DIAGNOSIS — Z79899 Other long term (current) drug therapy: Secondary | ICD-10-CM

## 2024-03-14 MED ORDER — CARVEDILOL 25 MG PO TABS: 25.0000 mg | ORAL_TABLET | Freq: Every day | ORAL | 0 refills | Status: AC

## 2024-03-24 ENCOUNTER — Other Ambulatory Visit: Payer: Self-pay | Admitting: Physician Assistant

## 2024-03-24 DIAGNOSIS — E782 Mixed hyperlipidemia: Secondary | ICD-10-CM

## 2024-03-24 DIAGNOSIS — R7989 Other specified abnormal findings of blood chemistry: Secondary | ICD-10-CM

## 2024-04-12 ENCOUNTER — Other Ambulatory Visit: Payer: Self-pay | Admitting: Physician Assistant

## 2024-04-12 DIAGNOSIS — Z79899 Other long term (current) drug therapy: Secondary | ICD-10-CM

## 2024-04-16 ENCOUNTER — Other Ambulatory Visit: Payer: Self-pay | Admitting: Physician Assistant

## 2024-04-17 ENCOUNTER — Other Ambulatory Visit: Payer: Self-pay | Admitting: Physician Assistant

## 2024-04-17 DIAGNOSIS — E782 Mixed hyperlipidemia: Secondary | ICD-10-CM

## 2024-04-17 DIAGNOSIS — R7989 Other specified abnormal findings of blood chemistry: Secondary | ICD-10-CM

## 2024-04-18 MED ORDER — ROSUVASTATIN CALCIUM 20 MG PO TABS: 20.0000 mg | ORAL_TABLET | Freq: Every day | ORAL | 0 refills | Status: DC

## 2024-05-02 ENCOUNTER — Ambulatory Visit

## 2024-05-02 VITALS — BP 136/80 | HR 90 | Ht 67.0 in | Wt 190.0 lb

## 2024-05-02 DIAGNOSIS — E782 Mixed hyperlipidemia: Secondary | ICD-10-CM | POA: Diagnosis not present

## 2024-05-02 DIAGNOSIS — I2510 Atherosclerotic heart disease of native coronary artery without angina pectoris: Secondary | ICD-10-CM | POA: Diagnosis not present

## 2024-05-02 DIAGNOSIS — Z794 Long term (current) use of insulin: Secondary | ICD-10-CM | POA: Diagnosis not present

## 2024-05-02 DIAGNOSIS — E1143 Type 2 diabetes mellitus with diabetic autonomic (poly)neuropathy: Secondary | ICD-10-CM | POA: Diagnosis not present

## 2024-05-02 DIAGNOSIS — I10 Essential (primary) hypertension: Secondary | ICD-10-CM | POA: Diagnosis not present

## 2024-05-02 LAB — LIPID PANEL
Chol/HDL Ratio: 2.7 ratio (ref 0.0–4.4)
Cholesterol, Total: 130 mg/dL (ref 100–199)
HDL: 49 mg/dL
LDL Chol Calc (NIH): 66 mg/dL (ref 0–99)
Triglycerides: 74 mg/dL (ref 0–149)
VLDL Cholesterol Cal: 15 mg/dL (ref 5–40)

## 2024-05-02 NOTE — Progress Notes (Signed)
"   °  °  Cardiology Office Note Date:  05/02/2024  ID:  Danielle Fisher, DOB 10-May-1955, MRN [id number] PCP:  [facility]  Cardiologist:   Joelle VEAR Ren Donley, MD  Chief Complaint  Patient presents with   Coronary Artery Disease     Problems CAD s/p RCA PCI 12/16 LHC 12/16: PDA 60% TTE 1/17: 55-60% M: AE10, ASA81, CL25 daily? CL75, EE10, insulin , LN25, RN20, SE weekly  Visits 10/24: No significant changes; had 16 IBS weight gain 12/25: LP, HA1C, BP, weight, D/c ASA    Discussed the use of AI scribe software for clinical note transcription with the patient, who gave verbal consent to proceed.  History of Present Illness   Danielle Fisher is a 68 year old female with coronary artery disease who presents for a cardiovascular follow-up. She reports no major changes since the last visit, with no chest pain or shortness of breath. She had been walking a mile every other day before the weather became cold and tolerated this well. She does not monitor her blood pressure at home despite having an arm blood pressure cuff. She takes aspirin  and Plavix  without bleeding or easy bruising. Recent blood work did not include a cholesterol level.     ROS: Please see the history of present illness. All other systems are reviewed and negative.   PHYSICAL EXAM: VS:  BP 136/80 (BP Location: Left Arm, Patient Position: Sitting, Cuff Size: Large)   Pulse 90   Ht 5' 7 (1.702 m)   Wt 190 lb (86.2 kg)   SpO2 96%   BMI 29.76 kg/m  , BMI Body mass index is 29.76 kg/m. GEN: Well nourished, well developed, in no acute distress HEENT: normal Neck: no JVD, carotid bruits, or masses Cardiac: RRR; no murmurs, rubs, or gallops,no edema  Respiratory:  CTAB bilaterally, normal work of breathing GI: soft, nontender, nondistended, + BS Extremities: No LE edema Skin: warm and dry, no rash Neuro:  Strength and sensation are intact  Recent Labs: Reviewed  Studies: Reviewed  ASSESSMENT AND  PLAN: Danielle Fisher is a 68 y.o. female who presents for follow up.    Coronary artery disease of native coronary arteries Coronary artery disease with previous stent placement. No recent chest pain or dyspnea. Dual antiplatelet therapy no longer necessary. - Discontinued aspirin . - Continue Plavix .  Essential hypertension Hypertension management discussed. Advised home blood pressure monitoring for accurate readings. - Provided blood pressure log for home monitoring.  Mixed hyperlipidemia Cholesterol levels not recently checked. Plan to assess current levels for management. - Ordered cholesterol test.      Signed, Joelle VEAR Ren Donley, MD  05/02/2024 10:09 AM    [facility] "

## 2024-05-02 NOTE — Patient Instructions (Signed)
 Medication Instructions:  Stop Aspirin  *If you need a refill on your cardiac medications before your next appointment, please call your pharmacy*  Lab Work: Lipid panel at [facility] If you have labs (blood work) drawn today and your tests are completely normal, you will receive your results only by: MyChart Message (if you have MyChart) OR A paper copy in the mail If you have any lab test that is abnormal or we need to change your treatment, we will call you to review the results.  Testing/Procedures: NONE  Follow-Up: At [facility], you and your health needs are our priority.  As part of our continuing mission to provide you with exceptional heart care, our providers are all part of one team.  This team includes your primary Cardiologist (physician) and Advanced Practice Providers or APPs (Physician Assistants and Nurse Practitioners) who all work together to provide you with the care you need, when you need it.  Your next appointment:   1 year(s)  Provider:   Dunn, PA-C   We recommend signing up for the patient portal called MyChart.  Sign up information is provided on this After Visit Summary.  MyChart is used to connect with patients for Virtual Visits (Telemedicine).  Patients are able to view lab/test results, encounter notes, upcoming appointments, etc.  Non-urgent messages can be sent to your provider as well.   To learn more about what you can do with MyChart, go to [email or web address].   Other Instructions BP log: Take your blood pressure twice daily for 14 days and send us  the readings.

## 2024-05-05 ENCOUNTER — Ambulatory Visit: Payer: Self-pay

## 2024-05-19 ENCOUNTER — Other Ambulatory Visit: Payer: Self-pay | Admitting: Physician Assistant

## 2024-05-19 DIAGNOSIS — R7989 Other specified abnormal findings of blood chemistry: Secondary | ICD-10-CM

## 2024-05-19 DIAGNOSIS — E782 Mixed hyperlipidemia: Secondary | ICD-10-CM

## 2024-06-12 ENCOUNTER — Other Ambulatory Visit: Payer: Self-pay | Admitting: Physician Assistant
